# Patient Record
Sex: Female | Born: 1945 | Race: White | Hispanic: No | Marital: Married | Smoking: Never smoker
Health system: Southern US, Community
[De-identification: ages and names within clinical notes are randomized; demographics above are authoritative.]

## PROBLEM LIST (undated history)

## (undated) DIAGNOSIS — L309 Dermatitis, unspecified: Secondary | ICD-10-CM

## (undated) DIAGNOSIS — E039 Hypothyroidism, unspecified: Secondary | ICD-10-CM

## (undated) DIAGNOSIS — K5792 Diverticulitis of intestine, part unspecified, without perforation or abscess without bleeding: Secondary | ICD-10-CM

## (undated) DIAGNOSIS — D699 Hemorrhagic condition, unspecified: Secondary | ICD-10-CM

## (undated) DIAGNOSIS — M5126 Other intervertebral disc displacement, lumbar region: Secondary | ICD-10-CM

## (undated) DIAGNOSIS — M5030 Other cervical disc degeneration, unspecified cervical region: Secondary | ICD-10-CM

## (undated) DIAGNOSIS — G709 Myoneural disorder, unspecified: Secondary | ICD-10-CM

## (undated) HISTORY — PX: CHOLECYSTECTOMY: SHX55

## (undated) HISTORY — PX: COLONOSCOPY: SHX174

---

## 1999-07-22 ENCOUNTER — Other Ambulatory Visit: Admission: RE | Admit: 1999-07-22 | Discharge: 1999-07-22 | Payer: Self-pay | Admitting: Gynecology

## 1999-12-15 ENCOUNTER — Encounter: Payer: Self-pay | Admitting: Gynecology

## 1999-12-15 ENCOUNTER — Encounter: Admission: RE | Admit: 1999-12-15 | Discharge: 1999-12-15 | Payer: Self-pay | Admitting: Gynecology

## 2000-12-17 ENCOUNTER — Encounter: Payer: Self-pay | Admitting: Gynecology

## 2000-12-17 ENCOUNTER — Encounter: Admission: RE | Admit: 2000-12-17 | Discharge: 2000-12-17 | Payer: Self-pay | Admitting: Gynecology

## 2000-12-31 ENCOUNTER — Encounter: Admission: RE | Admit: 2000-12-31 | Discharge: 2000-12-31 | Payer: Self-pay | Admitting: Internal Medicine

## 2000-12-31 ENCOUNTER — Encounter (HOSPITAL_BASED_OUTPATIENT_CLINIC_OR_DEPARTMENT_OTHER): Payer: Self-pay | Admitting: Internal Medicine

## 2001-04-29 ENCOUNTER — Encounter: Admission: RE | Admit: 2001-04-29 | Discharge: 2001-04-29 | Payer: Self-pay | Admitting: Internal Medicine

## 2001-04-29 ENCOUNTER — Encounter: Payer: Self-pay | Admitting: Internal Medicine

## 2001-12-20 ENCOUNTER — Encounter: Admission: RE | Admit: 2001-12-20 | Discharge: 2001-12-20 | Payer: Self-pay | Admitting: Internal Medicine

## 2001-12-20 ENCOUNTER — Encounter: Payer: Self-pay | Admitting: Internal Medicine

## 2002-05-23 ENCOUNTER — Other Ambulatory Visit: Admission: RE | Admit: 2002-05-23 | Discharge: 2002-05-23 | Payer: Self-pay | Admitting: Gynecology

## 2003-05-24 ENCOUNTER — Encounter: Admission: RE | Admit: 2003-05-24 | Discharge: 2003-05-24 | Payer: Self-pay | Admitting: Internal Medicine

## 2003-06-25 ENCOUNTER — Encounter: Admission: RE | Admit: 2003-06-25 | Discharge: 2003-06-25 | Payer: Self-pay | Admitting: Diagnostic Radiology

## 2004-02-14 ENCOUNTER — Other Ambulatory Visit: Admission: RE | Admit: 2004-02-14 | Discharge: 2004-02-14 | Payer: Self-pay | Admitting: Gynecology

## 2004-06-18 ENCOUNTER — Encounter: Admission: RE | Admit: 2004-06-18 | Discharge: 2004-06-18 | Payer: Self-pay | Admitting: Internal Medicine

## 2005-07-03 ENCOUNTER — Encounter: Admission: RE | Admit: 2005-07-03 | Discharge: 2005-07-03 | Payer: Self-pay | Admitting: Internal Medicine

## 2006-07-09 ENCOUNTER — Encounter: Admission: RE | Admit: 2006-07-09 | Discharge: 2006-07-09 | Payer: Self-pay | Admitting: Internal Medicine

## 2007-07-13 ENCOUNTER — Encounter: Admission: RE | Admit: 2007-07-13 | Discharge: 2007-07-13 | Payer: Self-pay | Admitting: Internal Medicine

## 2007-07-19 ENCOUNTER — Encounter: Admission: RE | Admit: 2007-07-19 | Discharge: 2007-07-19 | Payer: Self-pay | Admitting: Internal Medicine

## 2008-07-23 ENCOUNTER — Encounter: Admission: RE | Admit: 2008-07-23 | Discharge: 2008-07-23 | Payer: Self-pay | Admitting: Gynecology

## 2008-09-15 ENCOUNTER — Encounter: Admission: RE | Admit: 2008-09-15 | Discharge: 2008-09-15 | Payer: Self-pay | Admitting: Orthopedic Surgery

## 2009-07-26 ENCOUNTER — Encounter: Admission: RE | Admit: 2009-07-26 | Discharge: 2009-07-26 | Payer: Self-pay | Admitting: Internal Medicine

## 2010-08-21 ENCOUNTER — Encounter
Admission: RE | Admit: 2010-08-21 | Discharge: 2010-08-21 | Payer: Self-pay | Source: Home / Self Care | Attending: Internal Medicine | Admitting: Internal Medicine

## 2011-09-02 ENCOUNTER — Other Ambulatory Visit: Payer: Self-pay | Admitting: Internal Medicine

## 2011-09-02 DIAGNOSIS — Z1231 Encounter for screening mammogram for malignant neoplasm of breast: Secondary | ICD-10-CM

## 2011-09-02 DIAGNOSIS — Z780 Asymptomatic menopausal state: Secondary | ICD-10-CM

## 2011-10-01 ENCOUNTER — Ambulatory Visit: Payer: Self-pay

## 2011-10-01 ENCOUNTER — Other Ambulatory Visit: Payer: Self-pay

## 2011-10-14 ENCOUNTER — Other Ambulatory Visit: Payer: Self-pay

## 2011-10-14 ENCOUNTER — Ambulatory Visit: Payer: Self-pay

## 2012-01-06 ENCOUNTER — Ambulatory Visit
Admission: RE | Admit: 2012-01-06 | Discharge: 2012-01-06 | Disposition: A | Discharge status: Home / Self Care | Payer: Medicare Other | Source: Ambulatory Visit | Attending: Internal Medicine | Admitting: Internal Medicine

## 2012-01-06 DIAGNOSIS — Z780 Asymptomatic menopausal state: Secondary | ICD-10-CM

## 2012-01-06 DIAGNOSIS — Z1231 Encounter for screening mammogram for malignant neoplasm of breast: Secondary | ICD-10-CM

## 2013-01-30 ENCOUNTER — Other Ambulatory Visit: Payer: Self-pay

## 2013-01-30 DIAGNOSIS — Z1231 Encounter for screening mammogram for malignant neoplasm of breast: Secondary | ICD-10-CM

## 2013-03-01 ENCOUNTER — Ambulatory Visit: Payer: Medicare Other

## 2013-03-23 ENCOUNTER — Ambulatory Visit
Admission: RE | Admit: 2013-03-23 | Discharge: 2013-03-23 | Disposition: A | Discharge status: Home / Self Care | Payer: Medicare Other | Source: Ambulatory Visit

## 2013-03-23 DIAGNOSIS — Z1231 Encounter for screening mammogram for malignant neoplasm of breast: Secondary | ICD-10-CM

## 2014-03-12 ENCOUNTER — Other Ambulatory Visit: Payer: Self-pay | Admitting: Internal Medicine

## 2014-03-12 ENCOUNTER — Other Ambulatory Visit: Payer: Self-pay

## 2014-03-12 DIAGNOSIS — M8580 Other specified disorders of bone density and structure, unspecified site: Secondary | ICD-10-CM

## 2014-03-12 DIAGNOSIS — Z1231 Encounter for screening mammogram for malignant neoplasm of breast: Secondary | ICD-10-CM

## 2014-04-12 ENCOUNTER — Ambulatory Visit
Admission: RE | Admit: 2014-04-12 | Discharge: 2014-04-12 | Disposition: A | Discharge status: Home / Self Care | Payer: Medicare Other | Source: Ambulatory Visit | Attending: Internal Medicine | Admitting: Internal Medicine

## 2014-04-12 ENCOUNTER — Ambulatory Visit
Admission: RE | Admit: 2014-04-12 | Discharge: 2014-04-12 | Disposition: A | Discharge status: Home / Self Care | Payer: Medicare Other | Source: Ambulatory Visit

## 2014-04-12 DIAGNOSIS — Z1231 Encounter for screening mammogram for malignant neoplasm of breast: Secondary | ICD-10-CM

## 2014-04-12 DIAGNOSIS — M8580 Other specified disorders of bone density and structure, unspecified site: Secondary | ICD-10-CM

## 2015-03-20 ENCOUNTER — Other Ambulatory Visit: Payer: Self-pay

## 2015-03-20 DIAGNOSIS — Z1231 Encounter for screening mammogram for malignant neoplasm of breast: Secondary | ICD-10-CM

## 2015-04-25 ENCOUNTER — Ambulatory Visit: Payer: Self-pay

## 2015-06-20 ENCOUNTER — Ambulatory Visit
Admission: RE | Admit: 2015-06-20 | Discharge: 2015-06-20 | Disposition: A | Discharge status: Home / Self Care | Payer: Medicare Other | Source: Ambulatory Visit

## 2015-06-20 DIAGNOSIS — Z1231 Encounter for screening mammogram for malignant neoplasm of breast: Secondary | ICD-10-CM

## 2015-12-05 ENCOUNTER — Other Ambulatory Visit: Payer: Self-pay | Admitting: Internal Medicine

## 2015-12-05 ENCOUNTER — Ambulatory Visit
Admission: RE | Admit: 2015-12-05 | Discharge: 2015-12-05 | Disposition: A | Discharge status: Home / Self Care | Payer: Medicare Other | Source: Ambulatory Visit | Attending: Internal Medicine | Admitting: Internal Medicine

## 2015-12-05 DIAGNOSIS — R1030 Lower abdominal pain, unspecified: Secondary | ICD-10-CM

## 2015-12-20 ENCOUNTER — Encounter (HOSPITAL_COMMUNITY): Payer: Self-pay | Admitting: *Deleted

## 2015-12-21 ENCOUNTER — Encounter (HOSPITAL_COMMUNITY): Payer: Self-pay | Admitting: Anesthesiology

## 2015-12-21 ENCOUNTER — Ambulatory Visit (HOSPITAL_COMMUNITY): Payer: Medicare Other | Admitting: Anesthesiology

## 2015-12-21 ENCOUNTER — Ambulatory Visit (HOSPITAL_COMMUNITY)
Admission: RE | Admit: 2015-12-21 | Discharge: 2015-12-21 | Disposition: A | Discharge status: Home / Self Care | Payer: Medicare Other | Source: Ambulatory Visit | Attending: Orthopedic Surgery | Admitting: Orthopedic Surgery

## 2015-12-21 ENCOUNTER — Encounter (HOSPITAL_COMMUNITY)
Admission: RE | Disposition: A | Discharge status: Home / Self Care | Payer: Self-pay | Source: Ambulatory Visit | Attending: Orthopedic Surgery

## 2015-12-21 DIAGNOSIS — E039 Hypothyroidism, unspecified: Secondary | ICD-10-CM | POA: Diagnosis not present

## 2015-12-21 DIAGNOSIS — W19XXXA Unspecified fall, initial encounter: Secondary | ICD-10-CM | POA: Insufficient documentation

## 2015-12-21 DIAGNOSIS — S52571A Other intraarticular fracture of lower end of right radius, initial encounter for closed fracture: Secondary | ICD-10-CM | POA: Insufficient documentation

## 2015-12-21 DIAGNOSIS — Z79899 Other long term (current) drug therapy: Secondary | ICD-10-CM | POA: Insufficient documentation

## 2015-12-21 HISTORY — DX: Other intervertebral disc displacement, lumbar region: M51.26

## 2015-12-21 HISTORY — DX: Myoneural disorder, unspecified: G70.9

## 2015-12-21 HISTORY — DX: Diverticulitis of intestine, part unspecified, without perforation or abscess without bleeding: K57.92

## 2015-12-21 HISTORY — DX: Hemorrhagic condition, unspecified: D69.9

## 2015-12-21 HISTORY — PX: OPEN REDUCTION INTERNAL FIXATION (ORIF) DISTAL RADIAL FRACTURE: SHX5989

## 2015-12-21 HISTORY — DX: Hypothyroidism, unspecified: E03.9

## 2015-12-21 HISTORY — DX: Other cervical disc degeneration, unspecified cervical region: M50.30

## 2015-12-21 HISTORY — DX: Dermatitis, unspecified: L30.9

## 2015-12-21 LAB — POCT I-STAT, CHEM 8
BUN: 10 mg/dL (ref 6–20)
CHLORIDE: 99 mmol/L — AB (ref 101–111)
CREATININE: 0.5 mg/dL (ref 0.44–1.00)
Calcium, Ion: 1.13 mmol/L (ref 1.13–1.30)
GLUCOSE: 125 mg/dL — AB (ref 65–99)
HEMATOCRIT: 47 % — AB (ref 36.0–46.0)
HEMOGLOBIN: 16 g/dL — AB (ref 12.0–15.0)
POTASSIUM: 3.7 mmol/L (ref 3.5–5.1)
Sodium: 141 mmol/L (ref 135–145)
TCO2: 31 mmol/L (ref 0–100)

## 2015-12-21 SURGERY — OPEN REDUCTION INTERNAL FIXATION (ORIF) DISTAL RADIUS FRACTURE
Anesthesia: Monitor Anesthesia Care | Site: Wrist | Laterality: Right

## 2015-12-21 MED ORDER — PROPOFOL 500 MG/50ML IV EMUL: INTRAVENOUS | Status: DC | PRN

## 2015-12-21 MED ORDER — PROPOFOL 10 MG/ML IV BOLUS
INTRAVENOUS | Status: AC
  Filled 2015-12-21: qty 20

## 2015-12-21 MED ORDER — 0.9 % SODIUM CHLORIDE (POUR BTL) OPTIME
TOPICAL | Status: DC | PRN
  Administered 2015-12-21: 1000 mL

## 2015-12-21 MED ORDER — MIDAZOLAM HCL 5 MG/5ML IJ SOLN
INTRAMUSCULAR | Status: DC | PRN
  Administered 2015-12-21: 2 mg via INTRAVENOUS

## 2015-12-21 MED ORDER — ONDANSETRON HCL 4 MG/2ML IJ SOLN
INTRAMUSCULAR | Status: DC | PRN
  Administered 2015-12-21: 4 mg via INTRAVENOUS

## 2015-12-21 MED ORDER — HYDROMORPHONE HCL 1 MG/ML IJ SOLN: 0.2500 mg | INTRAMUSCULAR | Status: DC | PRN

## 2015-12-21 MED ORDER — MIDAZOLAM HCL 2 MG/2ML IJ SOLN
INTRAMUSCULAR | Status: AC
  Filled 2015-12-21: qty 2

## 2015-12-21 MED ORDER — FENTANYL CITRATE (PF) 250 MCG/5ML IJ SOLN
INTRAMUSCULAR | Status: AC
  Filled 2015-12-21: qty 5

## 2015-12-21 MED ORDER — OXYCODONE-ACETAMINOPHEN 5-325 MG PO TABS: 1.0000 | ORAL_TABLET | ORAL | Status: DC | PRN

## 2015-12-21 MED ORDER — ONDANSETRON HCL 4 MG/2ML IJ SOLN: 4.0000 mg | Freq: Once | INTRAMUSCULAR | Status: DC | PRN

## 2015-12-21 MED ORDER — MEPERIDINE HCL 25 MG/ML IJ SOLN: 6.2500 mg | INTRAMUSCULAR | Status: DC | PRN

## 2015-12-21 MED ORDER — DOCUSATE SODIUM 100 MG PO CAPS: 100.0000 mg | ORAL_CAPSULE | Freq: Two times a day (BID) | ORAL | Status: DC

## 2015-12-21 MED ORDER — LACTATED RINGERS IV SOLN
INTRAVENOUS | Status: DC
Start: 2015-12-21 — End: 2015-12-21
  Administered 2015-12-21: 08:00:00 via INTRAVENOUS

## 2015-12-21 MED ORDER — FENTANYL CITRATE (PF) 100 MCG/2ML IJ SOLN
INTRAMUSCULAR | Status: DC | PRN
  Administered 2015-12-21 (×4): 25 ug via INTRAVENOUS

## 2015-12-21 MED ORDER — CHLORHEXIDINE GLUCONATE 4 % EX LIQD: 60.0000 mL | Freq: Once | CUTANEOUS | Status: DC

## 2015-12-21 MED ORDER — METHOCARBAMOL 500 MG PO TABS: 500.0000 mg | ORAL_TABLET | Freq: Four times a day (QID) | ORAL | Status: DC

## 2015-12-21 MED ORDER — CEFAZOLIN SODIUM-DEXTROSE 2-4 GM/100ML-% IV SOLN
2.0000 g | INTRAVENOUS | Status: AC
  Administered 2015-12-21: 2 g via INTRAVENOUS
  Filled 2015-12-21: qty 100

## 2015-12-21 MED ORDER — VITAMIN C 500 MG PO TABS: 500.0000 mg | ORAL_TABLET | Freq: Every day | ORAL | Status: DC

## 2015-12-21 MED ORDER — PROPOFOL 500 MG/50ML IV EMUL
INTRAVENOUS | Status: DC | PRN
  Administered 2015-12-21: 50 ug/kg/min via INTRAVENOUS

## 2015-12-21 SURGICAL SUPPLY — 60 items
BANDAGE ACE 3X5.8 VEL STRL LF (GAUZE/BANDAGES/DRESSINGS) ×2 IMPLANT
BANDAGE ELASTIC 3 VELCRO ST LF (GAUZE/BANDAGES/DRESSINGS) ×1 IMPLANT
BANDAGE ELASTIC 4 VELCRO ST LF (GAUZE/BANDAGES/DRESSINGS) ×3 IMPLANT
BIT DRILL 2.2 SS TIBIAL (BIT) ×2 IMPLANT
BLADE SURG ROTATE 9660 (MISCELLANEOUS) IMPLANT
BNDG CMPR 9X4 STRL LF SNTH (GAUZE/BANDAGES/DRESSINGS) ×1
BNDG ESMARK 4X9 LF (GAUZE/BANDAGES/DRESSINGS) ×3 IMPLANT
BNDG GAUZE ELAST 4 BULKY (GAUZE/BANDAGES/DRESSINGS) ×3 IMPLANT
CANISTER SUCTION 2500CC (MISCELLANEOUS) ×3 IMPLANT
CORDS BIPOLAR (ELECTRODE) ×3 IMPLANT
COVER SURGICAL LIGHT HANDLE (MISCELLANEOUS) ×3 IMPLANT
CUFF TOURNIQUET SINGLE 18IN (TOURNIQUET CUFF) ×3 IMPLANT
CUFF TOURNIQUET SINGLE 24IN (TOURNIQUET CUFF) IMPLANT
DECANTER SPIKE VIAL GLASS SM (MISCELLANEOUS) ×1 IMPLANT
DRAPE OEC MINIVIEW 54X84 (DRAPES) ×3 IMPLANT
DRAPE SURG 17X11 SM STRL (DRAPES) ×3 IMPLANT
DRSG ADAPTIC 3X8 NADH LF (GAUZE/BANDAGES/DRESSINGS) ×3 IMPLANT
GAUZE SPONGE 4X4 12PLY STRL (GAUZE/BANDAGES/DRESSINGS) ×3 IMPLANT
GAUZE SPONGE 4X4 16PLY XRAY LF (GAUZE/BANDAGES/DRESSINGS) ×1 IMPLANT
GLOVE BIOGEL PI IND STRL 8.5 (GLOVE) ×1 IMPLANT
GLOVE BIOGEL PI INDICATOR 8.5 (GLOVE) ×2
GLOVE SURG ORTHO 8.0 STRL STRW (GLOVE) ×3 IMPLANT
GOWN STRL REUS W/ TWL LRG LVL3 (GOWN DISPOSABLE) ×1 IMPLANT
GOWN STRL REUS W/ TWL XL LVL3 (GOWN DISPOSABLE) ×1 IMPLANT
GOWN STRL REUS W/TWL LRG LVL3 (GOWN DISPOSABLE) ×3
GOWN STRL REUS W/TWL XL LVL3 (GOWN DISPOSABLE) ×3
K-WIRE 1.6 (WIRE) ×3
K-WIRE FX5X1.6XNS BN SS (WIRE) ×1
KIT BASIN OR (CUSTOM PROCEDURE TRAY) ×3 IMPLANT
KIT ROOM TURNOVER OR (KITS) ×3 IMPLANT
KWIRE FX5X1.6XNS BN SS (WIRE) IMPLANT
NDL HYPO 25X1 1.5 SAFETY (NEEDLE) ×1 IMPLANT
NEEDLE HYPO 25X1 1.5 SAFETY (NEEDLE) IMPLANT
NS IRRIG 1000ML POUR BTL (IV SOLUTION) ×3 IMPLANT
PACK ORTHO EXTREMITY (CUSTOM PROCEDURE TRAY) ×3 IMPLANT
PAD ARMBOARD 7.5X6 YLW CONV (MISCELLANEOUS) ×6 IMPLANT
PAD CAST 4YDX4 CTTN HI CHSV (CAST SUPPLIES) ×1 IMPLANT
PADDING CAST COTTON 4X4 STRL (CAST SUPPLIES) ×3
PEG LOCKING SMOOTH 2.2X18 (Peg) ×2 IMPLANT
PEG LOCKING SMOOTH 2.2X20 (Screw) ×6 IMPLANT
PEG LOCKING SMOOTH 2.2X22 (Screw) ×4 IMPLANT
PLATE DVR CROSSLOCK STD RT (Plate) ×2 IMPLANT
PUTTY DBM STAGRAFT PLUS 2CC (Putty) ×2 IMPLANT
SCREW LOCK 14X2.7X 3 LD TPR (Screw) IMPLANT
SCREW LOCK 16X2.7X 3 LD TPR (Screw) IMPLANT
SCREW LOCKING 2.7X14 (Screw) ×6 IMPLANT
SCREW LOCKING 2.7X16 (Screw) ×9 IMPLANT
SOAP 2 % CHG 4 OZ (WOUND CARE) ×3 IMPLANT
SPLINT FIBERGLASS 3X35 (CAST SUPPLIES) ×2 IMPLANT
SPONGE LAP 4X18 X RAY DECT (DISPOSABLE) ×1 IMPLANT
SUT PROLENE 4 0 PS 2 18 (SUTURE) ×4 IMPLANT
SUT VIC AB 2-0 FS1 27 (SUTURE) ×2 IMPLANT
SUT VICRYL 4-0 PS2 18IN ABS (SUTURE) ×2 IMPLANT
SYR CONTROL 10ML LL (SYRINGE) IMPLANT
TOWEL OR 17X24 6PK STRL BLUE (TOWEL DISPOSABLE) ×3 IMPLANT
TOWEL OR 17X26 10 PK STRL BLUE (TOWEL DISPOSABLE) ×3 IMPLANT
TUBE CONNECTING 12'X1/4 (SUCTIONS) ×1
TUBE CONNECTING 12X1/4 (SUCTIONS) ×2 IMPLANT
WATER STERILE IRR 1000ML POUR (IV SOLUTION) ×3 IMPLANT
YANKAUER SUCT BULB TIP NO VENT (SUCTIONS) IMPLANT

## 2015-12-21 NOTE — Progress Notes (Signed)
Orthopedic Tech Progress Note Patient Details:  Janet HumanRuth Ann Janet 1945-11-23 [id number]  Ortho Devices Type of Ortho Device: Arm sling Ortho Device/Splint Location: rue Ortho Device/Splint Interventions: Application   Alison Kubicki 12/21/2015, 11:06 AM

## 2015-12-21 NOTE — Brief Op Note (Signed)
12/21/2015  7:44 AM  PATIENT:  Janet Janet  70 y.o. female  PRE-OPERATIVE DIAGNOSIS:  RIGHT DISTAL RADIUS FRACTURE DISPLACED  POST-OPERATIVE DIAGNOSIS:  * No post-op diagnosis entered *  PROCEDURE:  Procedure(s): RIGHT OPEN REDUCTION INTERNAL FIXATION (ORIF) DISTAL RADIAL FRACTURE AND REPAIR AS NECESSARY (Right)  SURGEON:  Surgeon(s) and Role:    * Bradly BienenstockFred Gayna Braddy, MD - Primary  PHYSICIAN ASSISTANT:   ASSISTANTS: none   ANESTHESIA:   regional  EBL:     BLOOD ADMINISTERED:none  DRAINS: none   LOCAL MEDICATIONS USED:  MARCAINE     SPECIMEN:  No Specimen  DISPOSITION OF SPECIMEN:  N/A  COUNTS:  YES  TOURNIQUET:    DICTATION: .Other Dictation: Dictation Number [id number]  PLAN OF CARE: Discharge to home after PACU  PATIENT DISPOSITION:  PACU - hemodynamically stable.   Delay start of Pharmacological VTE agent (>24hrs) due to surgical blood loss or risk of bleeding: not applicable

## 2015-12-21 NOTE — Anesthesia Postprocedure Evaluation (Signed)
Anesthesia Post Note  Patient: Brown HumanRuth Ann Buescher  Procedure(s) Performed: Procedure(s) (LRB): RIGHT OPEN REDUCTION INTERNAL FIXATION (ORIF) DISTAL RADIAL FRACTURE AND REPAIR AS NECESSARY (Right)  Patient location during evaluation: PACU Anesthesia Type: Regional Level of consciousness: awake and alert and patient cooperative Pain management: pain level controlled Vital Signs Assessment: post-procedure vital signs reviewed and stable Respiratory status: spontaneous breathing and respiratory function stable Cardiovascular status: stable Anesthetic complications: no    Last Vitals:  Filed Vitals:   12/21/15 1109 12/21/15 1124  BP: 136/58 135/82  Pulse: 69 72  Temp:    Resp: 15 15    Last Pain:  Filed Vitals:   12/21/15 1132  PainSc: 3                  Fatou Dunnigan DAVID

## 2015-12-21 NOTE — Progress Notes (Signed)
Orthopedic Tech Progress Note Patient Details:  Janet Brown June 17, 1946 [id number]  Patient ID: Brown Humanuth Ann Brosious, female   DOB: June 17, 1946, 70 y.o.   MRN: [id number] Viewed order from doctor's order list  [name], [name] 12/21/2015, 11:07 AM

## 2015-12-21 NOTE — Anesthesia Preprocedure Evaluation (Signed)
Anesthesia Evaluation  Patient identified by MRN, date of birth, ID band Patient awake    Reviewed: Allergy & Precautions, NPO status , Patient's Chart, lab work & pertinent test results  Airway Mallampati: I  TM Distance: >3 FB Neck ROM: Full    Dental   Pulmonary    Pulmonary exam normal       Cardiovascular Normal cardiovascular exam    Neuro/Psych    GI/Hepatic   Endo/Other    Renal/GU      Musculoskeletal   Abdominal   Peds  Hematology   Anesthesia Other Findings   Reproductive/Obstetrics                             Anesthesia Physical Anesthesia Plan  ASA: II  Anesthesia Plan: Regional   Post-op Pain Management:    Induction: Intravenous  Airway Management Planned: Natural Airway  Additional Equipment:   Intra-op Plan:   Post-operative Plan:   Informed Consent: I have reviewed the patients History and Physical, chart, labs and discussed the procedure including the risks, benefits and alternatives for the proposed anesthesia with the patient or authorized representative who has indicated his/her understanding and acceptance.     Plan Discussed with: CRNA and Surgeon  Anesthesia Plan Comments:         Anesthesia Quick Evaluation  

## 2015-12-21 NOTE — Transfer of Care (Signed)
Immediate Anesthesia Transfer of Care Note  Patient: Janet Janet  Procedure(s) Performed: Procedure(s): RIGHT OPEN REDUCTION INTERNAL FIXATION (ORIF) DISTAL RADIAL FRACTURE AND REPAIR AS NECESSARY (Right)  Patient Location: PACU  Anesthesia Type:MAC and Regional  Level of Consciousness: awake, alert , oriented and sedated  Airway & Oxygen Therapy: Patient Spontanous Breathing and Patient connected to nasal cannula oxygen  Post-op Assessment: Report given to RN, Post -op Vital signs reviewed and stable and Patient moving all extremities  Post vital signs: Reviewed and stable  Last Vitals:  Filed Vitals:   12/21/15 0757  BP: 152/83  Pulse: 76  Temp: 37.2 C  Resp: 16    Last Pain:  Filed Vitals:   12/21/15 0801  PainSc: 3       Patients Stated Pain Goal: 4 (12/21/15 0757)  Complications: No apparent anesthesia complications

## 2015-12-21 NOTE — Discharge Instructions (Signed)
KEEP BANDAGE CLEAN AND DRY CALL OFFICE FOR F/U APPT [phone] in 13 days DR [facility] [phone] KEEP HAND ELEVATED ABOVE HEART OK TO APPLY ICE TO OPERATIVE AREA CONTACT OFFICE IF ANY WORSENING PAIN OR CONCERNS.

## 2015-12-21 NOTE — H&P (Signed)
Janet Janet is an 70 y.o. female.   Chief Complaint: Right wrist injury after a fall HPI: Pt sustained closed right distal radius fracture Pt here for surgery No prior surgery to right wrist Pt seen/evaluated in office yesterday  Past Medical History  Diagnosis Date  . Bleeds easily (HCC)   . Hypothyroidism   . HNP (herniated nucleus pulposus), lumbar     per patient- doesnt have much pain  . DDD (degenerative disc disease), cervical     arthritis - Shoulder  . Diverticulitis   . Neuromuscular disorder (HCC)     "bad nerve right arm"  . Eczema     Past Surgical History  Procedure Laterality Date  . Cholecystectomy    . Colonoscopy      History reviewed. No pertinent family history. Social History:  reports that she has never smoked. She does not have any smokeless tobacco history on file. She reports that she does not drink alcohol or use illicit drugs.  Allergies:  Allergies  Allergen Reactions  . Other     DARVOCET    Medications Prior to Admission  Medication Sig Dispense Refill  . acetaminophen (TYLENOL) 325 MG tablet Take 650 mg by mouth every 6 (six) hours as needed.    Marland Kitchen. ibuprofen (ADVIL,MOTRIN) 200 MG tablet Take 200 mg by mouth every 6 (six) hours as needed.    Marland Kitchen. levothyroxine (SYNTHROID, LEVOTHROID) 100 MCG tablet Take 100 mcg by mouth daily before breakfast.      No results found for this or any previous visit (from the past 48 hour(s)). No results found.  ROSNO RECENT ILLNESSES OR HOSPITALIZATIONS  Height 5' 7.5" (1.715 m), weight 65.318 kg (144 lb). Physical Exam  General Appearance:  Alert, cooperative, no distress, appears stated age  Head:  Normocephalic, without obvious abnormality, atraumatic  Eyes:  Pupils equal, conjunctiva/corneas clear,         Throat: Lips, mucosa, and tongue normal; teeth and gums normal  Neck: No visible masses     Lungs:   respirations unlabored  Chest Wall:  No tenderness or deformity  Heart:  Regular rate and  rhythm,  Abdomen:   Soft, non-tender,         Extremities: RUE: SKIN INTACT FINGERS WARM WELL PERFUSED ABLE TO FLEX AND EXTEND THUMB GOOD DIGITAL MOTION  Pulses: 2+ and symmetric  Skin: Skin color, texture, turgor normal, no rashes or lesions     Neurologic: Normal    Assessment/Plan RIGHT COMMIINUTED DISTAL RADIUS FRACTURE, DISPLACED VOLARLY  RIGHT WRIST OPEN REDUCTION AND INTERNAL FIXATION AND REPAIR AS INDICATED  R/B/A DISCUSSED WITH PT IN OFFICE.  PT VOICED UNDERSTANDING OF PLAN CONSENT SIGNED DAY OF SURGERY PT SEEN AND EXAMINED PRIOR TO OPERATIVE PROCEDURE/DAY OF SURGERY SITE MARKED. QUESTIONS ANSWERED WILL GO HOME FOLLOWING SURGERY  WE ARE PLANNING SURGERY FOR YOUR UPPER EXTREMITY. THE RISKS AND BENEFITS OF SURGERY INCLUDE BUT NOT LIMITED TO BLEEDING INFECTION, DAMAGE TO NEARBY NERVES ARTERIES TENDONS, FAILURE OF SURGERY TO ACCOMPLISH ITS INTENDED GOALS, PERSISTENT SYMPTOMS AND NEED FOR FURTHER SURGICAL INTERVENTION. WITH THIS IN MIND WE WILL PROCEED. I HAVE DISCUSSED WITH THE PATIENT THE PRE AND POSTOPERATIVE REGIMEN AND THE DOS AND DON'TS. PT VOICED UNDERSTANDING AND INFORMED CONSENT SIGNED.  Sharma CovertORTMANN,Janet Janet 12/21/2015, 7:39 AM

## 2015-12-23 ENCOUNTER — Encounter (HOSPITAL_COMMUNITY): Payer: Self-pay | Admitting: Orthopedic Surgery

## 2015-12-23 NOTE — Op Note (Signed)
NAME:  Janet Brown, Janet Brown                  ACCOUNT NO.:  [id number]  MEDICAL RECORD NO.:  [id number]  LOCATION:                                FACILITY:  [facility]  PHYSICIAN:  Sharma CovertFred W. Hulen Mandler IV, M.D.DATE OF BIRTH:  [birth year]  DATE OF PROCEDURE:  12/21/2015 DATE OF DISCHARGE:  12/21/2015                              OPERATIVE REPORT   PREOPERATIVE DIAGNOSIS:  Right wrist intra-articular distal radius fracture, 3 or more fragments.  POSTOPERATIVE DIAGNOSIS:  Right wrist intra-articular distal radius fracture, 3 or more fragments.  ATTENDING SURGEON:  Sharma CovertFred W. Gaberiel Youngblood IV, MD, who was scrubbed and present for the entire procedure.  ASSISTANT SURGEON:  None.  ANESTHESIA:  Supraclavicular block with IV sedation.  PROCEDURES: 1. Open reduction and internal fixation of displaced intra-articular     distal radius fracture, 3 or more fragments. 2. Right wrist brachioradialis tendon release. 3. Radiographs 3 views, right wrist.  RADIOGRAPHIC INTERPRETATION:  AP, lateral, and oblique views of the wrist did show the volar plate fixation placed in good position with good restoration of the radial height, inclination, and tilt.  SURGICAL IMPLANTS:  DVR Crosslock standard plate.  SURGICAL INDICATIONS:  Janet Brown is a right-hand-dominant female, who sustained a comminuted intra-articular distal radius fracture.  The patient was seen and evaluated in the office and recommended to undergo the above procedure.  Risks, benefits, and alternatives were discussed in detail with the patient.  Signed informed consent was obtained.  Risks include, but not limited to bleeding; infection; damage to nearby nerves, arteries, or tendons; loss of motion of wrist and digits; incomplete relief of symptoms; and need for further surgical intervention.  DESCRIPTION OF PROCEDURE:  The patient was properly identified in the preoperative holding area and marked with a permanent marker on the right wrist to indicate the  correct operative site.  The patient was brought back to the operating room, placed supine on the anesthesia room table.  General anesthesia was administered.  The patient tolerated this well.  A well-padded tourniquet was placed on right brachium and sealed with a 1000 drape.  The right upper extremity was then prepped and draped in normal sterile fashion.  Time-out was called, correct site was identified, and procedure then begun.  Attention was then turned to the right wrist.  A longitudinal incision made directly over the FCR sheath, dissection was then carried down through the skin and subcutaneous tissue.  The FCR sheath was then opened proximally and distally. Going through the floor of the FCR sheath, the FPL was then carefully swept out of the way.  The pronator quadratus was then elevated.  The fracture site was exposed.  Careful release of the brachioradialis was then carried out along the radial styloid release and the radial column. The first dorsal compartment tendons were then carefully protected.  The wound was then thoroughly irrigated.  2 mL of Biomet StaGraft was then placed into the volar defect and an open reduction was then performed. This was an intra-articular fracture; 3 or more fragments.  An open reduction was then performed with the comminuted volar portion extending into the joint.  A volar plate was then applied  and held distally with a K-wire.  The plate height was then adjusted using the oblong screw hole proximally. After fixation was then achieved, confirmed using mini C-arm, distal peg fixation was then carried out from an ulnar to radial direction with the distal locking pegs.  Screw fixation was then carried out on the shaft, completing the construct.  The wound was then thoroughly irrigated. Copious wound irrigation done.  Final radiographs were then obtained.  2- 0 Vicryl was used to close the pronator quadratus.  Subcutaneous tissues were closed  with 4-0 Vicryl, and skin was closed with 4-0 Prolene. Adaptic dressing and sterile compressive bandage were then applied.  The patient then placed in a well-padded sugar-tong splint.  Taken to recovery room in good condition.  Post stress examination of the wrist did not reveal any instability of the distal radioulnar joint.  There was good alignment.  The patient did have a small ulnar styloid fracture, but without any instability, no widening of the SL interval under stress radiography intraoperatively.  POSTPROCEDURAL PLAN:  The patient will be discharged home, seen back in the office in approximately 2 weeks for wound check, suture removal, application of a short-arm cast, put in the order for therapy to begin at 4-week mark, therapy at 4-week mark.  Radiographs at each visit.     Madelynn Done, M.D.     FWO/MEDQ  D:  12/21/2015  T:  12/21/2015  Job:  [id number]

## 2016-01-02 NOTE — Addendum Note (Signed)
Addendum  created 01/02/16 2231 by Arta BruceKevin Leily Capek, MD   Modules edited: Anesthesia Blocks and Procedures, Clinical Notes   Clinical Notes:  File: [id number]

## 2016-01-02 NOTE — Anesthesia Procedure Notes (Signed)
Anesthesia Regional Block:  Supraclavicular block  Pre-Anesthetic Checklist: ,, timeout performed, Correct Patient, Correct Site, Correct Laterality, Correct Procedure, Correct Position, site marked, Risks and benefits discussed,  Surgical consent,  Pre-op evaluation,  At surgeon's request and post-op pain management  Laterality: Right  Prep: chloraprep       Needles:  Injection technique: Single-shot  Needle Type: Echogenic Stimulator Needle     Needle Length: 9cm 9 cm Needle Gauge: 21 and 21 G    Additional Needles:  Procedures: ultrasound guided (picture in chart) and nerve stimulator Supraclavicular block  Nerve Stimulator or Paresthesia:  Response: 0.4 mA,   Additional Responses:   Narrative:  Start time: 12/21/2015 9:05 AM End time: 12/21/2015 9:15 AM Injection made incrementally with aspirations every 5 mL.  Performed by: Personally  Anesthesiologist: Arta BruceSSEY, Ifeoma Vallin  Additional Notes: Monitors applied. Patient sedated. Sterile prep and drape,hand hygiene and sterile gloves were used. Relevant anatomy identified.Needle position confirmed.Local anesthetic injected incrementally after negative aspiration. Local anesthetic spread visualized around nerve(s). Vascular puncture avoided. No complications. Image printed for medical record.The patient tolerated the procedure well.

## 2016-06-25 ENCOUNTER — Other Ambulatory Visit: Payer: Self-pay | Admitting: Internal Medicine

## 2016-06-25 DIAGNOSIS — Z1231 Encounter for screening mammogram for malignant neoplasm of breast: Secondary | ICD-10-CM

## 2016-07-14 ENCOUNTER — Ambulatory Visit
Admission: RE | Admit: 2016-07-14 | Discharge: 2016-07-14 | Disposition: A | Discharge status: Home / Self Care | Payer: Medicare Other | Source: Ambulatory Visit | Attending: Internal Medicine | Admitting: Internal Medicine

## 2016-07-14 DIAGNOSIS — Z1231 Encounter for screening mammogram for malignant neoplasm of breast: Secondary | ICD-10-CM

## 2017-06-10 ENCOUNTER — Other Ambulatory Visit: Payer: Self-pay | Admitting: Internal Medicine

## 2017-06-10 DIAGNOSIS — Z1231 Encounter for screening mammogram for malignant neoplasm of breast: Secondary | ICD-10-CM

## 2017-07-20 ENCOUNTER — Ambulatory Visit
Admission: RE | Admit: 2017-07-20 | Discharge: 2017-07-20 | Disposition: A | Discharge status: Home / Self Care | Payer: Medicare Other | Source: Ambulatory Visit | Attending: Internal Medicine | Admitting: Internal Medicine

## 2017-07-20 DIAGNOSIS — Z1231 Encounter for screening mammogram for malignant neoplasm of breast: Secondary | ICD-10-CM

## 2017-12-08 ENCOUNTER — Other Ambulatory Visit (HOSPITAL_COMMUNITY): Payer: Self-pay | Admitting: Internal Medicine

## 2017-12-08 ENCOUNTER — Ambulatory Visit (HOSPITAL_COMMUNITY)
Admission: RE | Admit: 2017-12-08 | Discharge: 2017-12-08 | Disposition: A | Discharge status: Home / Self Care | Payer: Medicare Other | Source: Ambulatory Visit | Attending: Vascular Surgery | Admitting: Vascular Surgery

## 2017-12-08 DIAGNOSIS — I872 Venous insufficiency (chronic) (peripheral): Secondary | ICD-10-CM | POA: Diagnosis not present

## 2017-12-08 DIAGNOSIS — R600 Localized edema: Secondary | ICD-10-CM | POA: Diagnosis not present

## 2017-12-08 DIAGNOSIS — R2241 Localized swelling, mass and lump, right lower limb: Secondary | ICD-10-CM | POA: Diagnosis present

## 2017-12-08 DIAGNOSIS — M25561 Pain in right knee: Secondary | ICD-10-CM | POA: Insufficient documentation

## 2018-01-18 ENCOUNTER — Encounter: Payer: Self-pay | Admitting: Neurology

## 2018-02-11 ENCOUNTER — Ambulatory Visit (INDEPENDENT_AMBULATORY_CARE_PROVIDER_SITE_OTHER): Payer: Medicare Other | Admitting: Neurology

## 2018-02-11 ENCOUNTER — Encounter: Payer: Self-pay | Admitting: Neurology

## 2018-02-11 VITALS — BP 100/70 | HR 83 | Ht 67.0 in | Wt 148.1 lb

## 2018-02-11 DIAGNOSIS — M5416 Radiculopathy, lumbar region: Secondary | ICD-10-CM | POA: Insufficient documentation

## 2018-02-11 DIAGNOSIS — M792 Neuralgia and neuritis, unspecified: Secondary | ICD-10-CM

## 2018-02-11 MED ORDER — GABAPENTIN 300 MG PO CAPS: ORAL_CAPSULE | ORAL | 5 refills | Status: DC

## 2018-02-11 NOTE — Patient Instructions (Addendum)
Start gabapentin 300mg  at bedtime for one week, then increase to one tablet twice daily  MRI lumbar spine without contrast
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
## 2018-02-16 ENCOUNTER — Ambulatory Visit
Admission: RE | Admit: 2018-02-16 | Discharge: 2018-02-16 | Disposition: A | Discharge status: Home / Self Care | Payer: Medicare Other | Source: Ambulatory Visit | Attending: Neurology | Admitting: Neurology

## 2018-02-16 DIAGNOSIS — M5416 Radiculopathy, lumbar region: Secondary | ICD-10-CM

## 2018-02-16 DIAGNOSIS — M792 Neuralgia and neuritis, unspecified: Secondary | ICD-10-CM

## 2018-02-18 ENCOUNTER — Telehealth: Payer: Self-pay | Admitting: Neurology

## 2018-02-18 NOTE — Telephone Encounter (Signed)
Results of MRI lumbar spine discussed with patient which shows multilevel degenerative changes with severe spinal stenosis at L4-5 with left foraminal stenosis and disc protrusion at L5-S1 impinging the right S1 nerve. There is also L5 subacute endplate fracture.   I recommended seeing a spine surgeon for their opinion, given the severity of her spinal stenosis. However, patient reports her radicular pain and foot numbness is improving slowly and she is mostly bothered by posterior knee pain due to Baker's cyst.  She is seeing Dr. Janey GreaserKendal, her orthopeadic surgeon for knee pain next week who may drain the cyst.    If her radicular pain does not improve or gets worse, the next step is epidural steroid injection with radiology and/or referral to spine specialist.   All questions were answered.   MRI lumbar spine 02/17/2018:  1. Diffuse facet and disc degeneration that has progressed from 2010. 2. L4-5 severe facet degeneration with bilateral active facet arthritis and anterolisthesis. Severe spinal stenosis at this level. Left foraminal impingement exacerbated by periarticular inflammation. 3. L3-4 moderate spinal stenosis. 4. L5-S1 chronic right paracentral protrusion impinging on the right S1 nerve root. 5. L5 superior endplate fracture which may be subacute  Onna Nodal K. Allena KatzPatel, DO

## 2018-06-14 ENCOUNTER — Other Ambulatory Visit: Payer: Self-pay | Admitting: Internal Medicine

## 2018-06-14 DIAGNOSIS — Z1231 Encounter for screening mammogram for malignant neoplasm of breast: Secondary | ICD-10-CM

## 2018-07-29 ENCOUNTER — Ambulatory Visit
Admission: RE | Admit: 2018-07-29 | Discharge: 2018-07-29 | Disposition: A | Discharge status: Home / Self Care | Payer: Medicare Other | Source: Ambulatory Visit | Attending: Internal Medicine | Admitting: Internal Medicine

## 2018-07-29 DIAGNOSIS — Z1231 Encounter for screening mammogram for malignant neoplasm of breast: Secondary | ICD-10-CM

## 2019-06-12 ENCOUNTER — Other Ambulatory Visit: Payer: Self-pay | Admitting: Internal Medicine

## 2019-06-12 DIAGNOSIS — Z1231 Encounter for screening mammogram for malignant neoplasm of breast: Secondary | ICD-10-CM

## 2019-07-31 ENCOUNTER — Other Ambulatory Visit: Payer: Self-pay

## 2019-07-31 ENCOUNTER — Ambulatory Visit
Admission: RE | Admit: 2019-07-31 | Discharge: 2019-07-31 | Disposition: A | Discharge status: Home / Self Care | Payer: Medicare Other | Source: Ambulatory Visit | Attending: Internal Medicine | Admitting: Internal Medicine

## 2019-07-31 DIAGNOSIS — Z1231 Encounter for screening mammogram for malignant neoplasm of breast: Secondary | ICD-10-CM

## 2020-06-25 ENCOUNTER — Other Ambulatory Visit: Payer: Self-pay | Admitting: Internal Medicine

## 2020-06-25 DIAGNOSIS — Z1231 Encounter for screening mammogram for malignant neoplasm of breast: Secondary | ICD-10-CM

## 2020-08-06 ENCOUNTER — Ambulatory Visit: Payer: Medicare Other

## 2020-08-21 ENCOUNTER — Ambulatory Visit: Payer: Medicare Other

## 2020-09-02 ENCOUNTER — Ambulatory Visit: Payer: Medicare Other

## 2020-10-10 ENCOUNTER — Other Ambulatory Visit: Payer: Self-pay

## 2020-10-10 ENCOUNTER — Ambulatory Visit
Admission: RE | Admit: 2020-10-10 | Discharge: 2020-10-10 | Disposition: A | Discharge status: Home / Self Care | Payer: Medicare Other | Source: Ambulatory Visit | Attending: Internal Medicine | Admitting: Internal Medicine

## 2020-10-10 DIAGNOSIS — Z1231 Encounter for screening mammogram for malignant neoplasm of breast: Secondary | ICD-10-CM

## 2021-01-03 ENCOUNTER — Other Ambulatory Visit: Payer: Self-pay | Admitting: Internal Medicine

## 2021-01-03 DIAGNOSIS — R42 Dizziness and giddiness: Secondary | ICD-10-CM

## 2021-01-06 ENCOUNTER — Ambulatory Visit
Admission: RE | Admit: 2021-01-06 | Discharge: 2021-01-06 | Disposition: A | Discharge status: Home / Self Care | Payer: Medicare Other | Source: Ambulatory Visit | Attending: Internal Medicine | Admitting: Internal Medicine

## 2021-01-06 DIAGNOSIS — R42 Dizziness and giddiness: Secondary | ICD-10-CM

## 2021-01-13 ENCOUNTER — Ambulatory Visit: Payer: Medicare Other | Attending: Internal Medicine | Admitting: Physical Therapy

## 2021-01-13 ENCOUNTER — Other Ambulatory Visit: Payer: Self-pay

## 2021-01-13 ENCOUNTER — Encounter: Payer: Self-pay | Admitting: Physical Therapy

## 2021-01-13 DIAGNOSIS — R42 Dizziness and giddiness: Secondary | ICD-10-CM

## 2021-01-13 DIAGNOSIS — H8112 Benign paroxysmal vertigo, left ear: Secondary | ICD-10-CM

## 2021-01-13 DIAGNOSIS — R2681 Unsteadiness on feet: Secondary | ICD-10-CM

## 2021-01-13 NOTE — Therapy (Signed)
OUTPATIENT PHYSICAL THERAPY VESTIBULAR EVALUATION   Patient Name: Janet Brown MRN: [id number] DOB:1945/09/26, 75 y.o., female Today's Date: 01/13/2021  PCP: Creola Corn, MD REFERRING PROVIDER: Creola Corn, MD   PT End of Session - 01/13/21 1321    Visit Number 1    Number of Visits 7    Date for PT Re-Evaluation 03/14/21    Authorization Type [employer] Vertigo; $30 copay    PT Start Time 1230    PT Stop Time 1315    PT Time Calculation (min) 45 min    Activity Tolerance Patient tolerated treatment well    Behavior During Therapy [facility] for tasks assessed/performed           Past Medical History:  Diagnosis Date  . Bleeds easily (HCC)   . DDD (degenerative disc disease), cervical    arthritis - Shoulder  . Diverticulitis   . Eczema   . HNP (herniated nucleus pulposus), lumbar    per patient- doesnt have much pain  . Hypothyroidism   . Neuromuscular disorder (HCC)    "bad nerve right arm"   Past Surgical History:  Procedure Laterality Date  . CHOLECYSTECTOMY    . COLONOSCOPY    . OPEN REDUCTION INTERNAL FIXATION (ORIF) DISTAL RADIAL FRACTURE Right 12/21/2015   Procedure: RIGHT OPEN REDUCTION INTERNAL FIXATION (ORIF) DISTAL RADIAL FRACTURE AND REPAIR AS NECESSARY;  Surgeon: Bradly Bienenstock, MD;  Location: [facility] OR;  Service: Orthopedics;  Laterality: Right;   Patient Active Problem List   Diagnosis Date Noted  . Lumbar radiculopathy, right 02/11/2018    ONSET DATE: 01/06/21  REFERRING DIAG: Dizzy Spells  THERAPY DIAG:  BPPV (benign paroxysmal positional vertigo), left  Dizziness and giddiness  Unsteadiness on feet  SUBJECTIVE:   SUBJECTIVE STATEMENT: Pt reports vertigo started 3 weeks ago - pt reports when she would lie down or get OOB she felt like her head was moving.  Pt reports also having difficulty maintaining her balance when standing and walking and had to use her cane.  This is patient's first episode of vertigo.  Pt reports now vertigo has resolved but  still feels off balance.  No longer using cane but is not back to baseline.  Pt reports before episode of vertigo pt was ill with lymph nodes swelling; no fever.                                                                                                                                                                                                             PERTINENT HISTORY: DDD  cervical, OA shoulder, HNP lumbar, hypothyroidism, neuromuscular disorder, R ORIF distal radial fracture  PAIN:  Are you having pain? No  PRECAUTIONS: Other: DDD cervical, OA shoulder, HNP lumbar, hypothyroidism, neuromuscular disorder, R ORIF distal radial fracture  WEIGHT BEARING RESTRICTIONS No  FALLS: Has patient fallen in last 6 months? No,   LIVING ENVIRONMENT: Lives with: lives with their family and lives with their spouse Lives in: House/apartment Stairs: Yes; External: 1 steps; Rail on NONE going up Has following equipment at home: Single point cane  PLOF: Independent and husband is mainly driving patient aroud longer distances, pt drives locally  PATIENT GOALS Work on balance  OBJECTIVE:   DIAGNOSTIC FINDINGS: CT scan unremarkable    SENSATION: Light touch: Deficits Pt reports changes in light touch in bilat lower leg and pt reports intermittent R foot goes numb   Pt reports pain and swelling in back of RUE in tricep area; upon palpation pt has small trigger point in tricep   Cervical AROM/PROM:  Pt slightly guarded with extension due to fear of dizziness.  STRENGTH: Bilat LE 4-/5 overall; R slightly weaker than LLE  GAIT: Gait pattern: step through pattern, decreased step length- Right, decreased step length- Left and decreased trunk rotation Distance walked: 115 Assistive device utilized: None Level of assistance: SBA Comments: very guarded with gait due to imbalance; not using cane today  PATIENT SURVEYS:  FOTO DPS: 63.6 and DFS: 49%   VESTIBULAR ASSESSMENT   GENERAL  OBSERVATION: Walking slowly and guarded    SYMPTOM BEHAVIOR:   Subjective history: See history above   Non-Vestibular symptoms: changes in hearing and tinnitus   Type of dizziness: Imbalance (Disequilibrium) and Spinning/Vertigo   Frequency: has not occurred this week but was intermittent   Duration: less than a minute   Aggravating factors: Induced by position change: lying supine and supine to sit   Relieving factors: slow movements   Progression of symptoms: better   OCULOMOTOR EXAM:   Ocular Alignment: abnormal and  pt reports having a L "Lazy eye" with poor vision.   Ocular ROM: L eye limited ROM   Spontaneous Nystagmus: absent   Gaze-Induced Nystagmus: absent   Smooth Pursuits: WFL based on patient history   Saccades: intact     VESTIBULAR - OCULAR REFLEX:    Slow VOR: Comment: neck guarding   VOR Cancellation: Comment: neck guarding   Head-Impulse Test: HIT Right: positive; Left: negative    POSITIONAL TESTING: Left Dix-Hallpike: upbeating, left nystagmus; Duration: 3 seconds  Right Roll Test: none; Duration: 0  Left Roll Test: none; Duration: 0    VESTIBULAR TREATMENT:  Canalith Repositioning:   Epley Left: Number of Reps: 1 and Response to Treatment: symptoms improved  PATIENT EDUCATION: Education details: clinical findings, PT POC and goals, reason why MD referred pt to neuro instead of ortho clinic near her house Person educated: Patient Education method: Explanation Education comprehension: verbalized understanding  ASSESSMENT:  CLINICAL IMPRESSION: Patient is a 75 y.o. female who was seen today for physical therapy evaluation and treatment for dizzy spells. Objective impairments include decreased balance, difficulty walking and dizziness. These impairments are limiting patient from community activity and driving. Personal factors including Transportation and 3+ comorbidities: DDD cervical, OA shoulder, HNP lumbar, hypothyroidism, neuromuscular disorder, R ORIF  distal radial fracture due to fall, cognitive impairments are also affecting patient's functional outcome. Patient will benefit from skilled PT to address above impairments and improve overall function.  REHAB POTENTIAL: Good  CLINICAL DECISION MAKING: Stable/uncomplicated  EVALUATION COMPLEXITY: Low   GOALS: Goals reviewed with patient? Yes  SHORT TERM GOALS:  STG Name Target Date Goal status  1 Pt will perform further assessment of vestibular system and balance.  Baseline: 01/27/2021  INITIAL  2 Pt will initiate vestibular and balance HEP Baseline:  01/27/2021  INITIAL  3 Pt will tolerate treatment of BPPV and will demonstrate negative positional testing for all canals  Baseline: L Posterior canal BPPV 01/27/2021  INITIAL                       LONG TERM GOALS:   LTG Name Target Date Goal status  1 Pt will perform final HEP with supervision Baseline: 02/10/2021  INITIAL  2 Pt will increase FOTO scores to 59 for Functional status and will increase positional status by 5 points  Baseline: DFS: 49; DPS: 63.6 02/10/2021  INITIAL  3 Pt will report no dizziness with bed mobility or with looking up at the ceiling Baseline: 02/10/2021  INITIAL  4 Pt will increase FGA by 4 points to indicate decreased falls risk  Baseline: TBA 02/10/2021  INITIAL                  PLAN: PT FREQUENCY: 1x/week  PT DURATION: 8 weeks  PLANNED INTERVENTIONS: Therapeutic exercises, Therapeutic activity, Neuro Muscular re-education, Balance training, Gait training, Patient/Family education, Joint mobilization, Stair training, Vestibular training and Canalith repositioning  PLAN FOR NEXT SESSION: Re-check for L BPPV and treat if indicated.  If clear, check for R BPPV, treat if indicated.  Assess MSQ and FGA, re set LTG if needed.  Initiate HEP - pt has cognitive issues so may need to demo to husband for safety.   Dierdre Highman, PT, DPT 01/13/21    1:36 PM    [facility] [address] [place], [place], [address] Phone: [phone]   Fax:  [phone]

## 2021-01-21 NOTE — Therapy (Signed)
OUTPATIENT PHYSICAL THERAPY VESTIBULAR TREATMENT NOTE   Patient Name: Janet Brown MRN: [id number] DOB:1945-11-13, 75 y.o., female Today's Date: 01/22/2021  PCP: Shon Baton, MD REFERRING PROVIDER: Shon Baton, MD   PT End of Session - 01/22/21 1231    Visit Number 2    Number of Visits 7    Date for PT Re-Evaluation 03/14/21    Authorization Type [employer] Vert[employer]; $30 copay    PT Start Time 1231    Activity Tolerance Patient tolerated treatment well    Behavior During Therapy [facility] for tasks assessed/performed           Past Medical History:  Diagnosis Date  . Bleeds easily ([place])   . DDD (degenerative disc disease), cervical    arthritis - Shoulder  . Diverticulitis   . Eczema   . HNP (herniated nucleus pulposus), lumbar    per patient- doesnt have much pain  . Hypothyroidism   . Neuromuscular disorder ([place])    "bad nerve right arm"   Past Surgical History:  Procedure Laterality Date  . CHOLECYSTECTOMY    . COLONOSCOPY    . OPEN REDUCTION INTERNAL FIXATION (ORIF) DISTAL RADIAL FRACTURE Right 12/21/2015   Procedure: RIGHT OPEN REDUCTION INTERNAL FIXATION (ORIF) DISTAL RADIAL FRACTURE AND REPAIR AS NECESSARY;  Surgeon: Iran Planas, MD;  Location: [place];  Service: Orthopedics;  Laterality: Right;   Patient Active Problem List   Diagnosis Date Noted  . Lumbar radiculopathy, right 02/11/2018    REFERRING DIAG: Dizzy Spells  THERAPY DIAG:  BPPV (benign paroxysmal positional vertigo), left  Dizziness and giddiness  Unsteadiness on feet  PERTINENT HISTORY: DDD cervical, OA shoulder, HNP lumbar, hypothyroidism, neuromuscular disorder, R ORIF distal radial fracture  PRECAUTIONS: Other: DDD cervical, OA shoulder, HNP lumbar, hypothyroidism, neuromuscular disorder, R ORIF distal radial fracture  SUBJECTIVE: Patient reports that she feels better since that initial evaluation. Denies any issues with the dizziness. No falls.   PAIN:  Are you having pain? No (denies  pain currently, but does have some intermittent pain in the RUE that starts in biceps region and radiates down to the elbow).   OBJECTIVE:   POSITIONAL TESTS:  Right Dix-Hallpike: none; Duration: Left Dix-Hallpike: none; Duration: Right Sidelying: none; Duration: Left Sidelying: none; Duration:   Motion Sensitivity Quotient  Intensity: 0 = none, 1 = Lightheaded, 2 = Mild, 3 = Moderate, 4 = Severe, 5 = Vomiting  Intensity  1. Sitting to supine 0  2. Supine to L side 0  3. Supine to R side 0  4. Supine to sitting 0  5. L Hallpike-Dix 0  6. Up from L  0  7. R Hallpike-Dix 0  8. Up from R  0  9. Sitting, head  tipped to L knee 0  10. Head up from L  knee 0  11. Sitting, head  tipped to R knee 0  12. Head up from R  knee 0  13. Sitting head turns x5 0  14.Sitting head nods x5 0  15. In stance, 180  turn to L  0  16. In stance, 180  turn to R 0      [facility] PT Assessment - 01/22/21 0001      Functional Gait  Assessment   Gait assessed  Yes    Gait Level Surface Walks 20 ft in less than 7 sec but greater than 5.5 sec, uses assistive device, slower speed, mild gait deviations, or deviates 6-10 in outside of the 12 in walkway width.  Change in Gait Speed Makes only minor adjustments to walking speed, or accomplishes a change in speed with significant gait deviations, deviates 10-15 in outside the 12 in walkway width, or changes speed but loses balance but is able to recover and continue walking.    Gait with Horizontal Head Turns Performs head turns smoothly with slight change in gait velocity (eg, minor disruption to smooth gait path), deviates 6-10 in outside 12 in walkway width, or uses an assistive device.    Gait with Vertical Head Turns Performs task with slight change in gait velocity (eg, minor disruption to smooth gait path), deviates 6 - 10 in outside 12 in walkway width or uses assistive device    Gait and Pivot Turn Pivot turns safely in greater than 3 sec and  stops with no loss of balance, or pivot turns safely within 3 sec and stops with mild imbalance, requires small steps to catch balance.    Step Over Obstacle Is able to step over one shoe box (4.5 in total height) without changing gait speed. No evidence of imbalance.    Gait with Narrow Base of Support Ambulates less than 4 steps heel to toe or cannot perform without assistance.    Gait with Eyes Closed Walks 20 ft, slow speed, abnormal gait pattern, evidence for imbalance, deviates 10-15 in outside 12 in walkway width. Requires more than 9 sec to ambulate 20 ft.    Ambulating Backwards Walks 20 ft, slow speed, abnormal gait pattern, evidence for imbalance, deviates 10-15 in outside 12 in walkway width.    Steps Alternating feet, must use rail.    Total Score 15    FGA comment: 15/30          Vestibular Treatment:   Gaze Stabilization:   Initiated slow VOR x 1 Horizontal x 30 seconds; increased cues required for proper completion as patient demo difficulty with technique. Unable to maintain eyes focused on target. VOR x 1 Vertical x 30 seconds. No dizziness reported with horizontal/vertical. Withheld from initiating gaze stabilization with HEP today due to challenge with technique.   PATIENT EDUCATION: Education details: FGA Results; Resolution of BPPV  Person educated: Patient Education method: Explanation Education comprehension: verbalized understanding  ASSESSMENT:  CLINICAL IMPRESSION: Today's skilled PT session included reassesment of BPPV. No nystagmus/vertigo symptoms with any positioning testing today indicating resolution of L Posterior Canal BPPV. Assessed MSQ with no reports of dizziness. Patient scoring 15/30 on FGA today indicating increased risk for falls. Initiated VOR x 1 with significant challenge noted with technique. Will continue to address at next session.   REHAB POTENTIAL: Good  CLINICAL DECISION MAKING: Stable/uncomplicated  EVALUATION COMPLEXITY:  Low   GOALS: Goals reviewed with patient? Yes  SHORT TERM GOALS:  STG Name Target Date Goal status  1 Pt will perform further assessment of vestibular system and balance.  Baseline: Assessed on 6/1 01/27/2021  MET  2 Pt will initiate vestibular and balance HEP Baseline:  01/27/2021  INITIAL  3 Pt will tolerate treatment of BPPV and will demonstrate negative positional testing for all canals  Baseline: L Posterior canal BPPV 01/27/2021  INITIAL   LONG TERM GOALS:   LTG Name Target Date Goal status  1 Pt will perform final HEP with supervision Baseline: 02/10/2021  INITIAL  2 Pt will increase FOTO scores to 59 for Functional status and will increase positional status by 5 points  Baseline: DFS: 49; DPS: 63.6 02/10/2021  INITIAL  3 Pt will report no dizziness with  bed mobility or with looking up at the ceiling Baseline: 02/10/2021  INITIAL  4 Pt will increase FGA by 4 points to indicate decreased falls risk  Baseline: 15/30 02/10/2021  INITIAL   PLAN: PT FREQUENCY: 1x/week  PT DURATION: 8 weeks  PLANNED INTERVENTIONS: Therapeutic exercises, Therapeutic activity, Neuro Muscular re-education, Balance training, Gait training, Patient/Family education, Joint mobilization, Stair training, Vestibular training and Canalith repositioning  PLAN FOR NEXT SESSION:  Continue VOR x 1, Initiate HEP with balance and VOR - pt has cognitive issues so may need to demo to husband for safety.     Jones Bales, PT, DPT 01/22/2021, 12:33 PM    [facility] [address] [place], [place], [address] Phone: [phone]   Fax:  [phone]  Patient name: Maribeth Jiles MRN: [id number] DOB: Jul 09, 1946

## 2021-01-22 ENCOUNTER — Ambulatory Visit: Payer: Medicare Other | Attending: Internal Medicine

## 2021-01-22 ENCOUNTER — Other Ambulatory Visit: Payer: Self-pay

## 2021-01-22 DIAGNOSIS — H8112 Benign paroxysmal vertigo, left ear: Secondary | ICD-10-CM | POA: Insufficient documentation

## 2021-01-22 DIAGNOSIS — R42 Dizziness and giddiness: Secondary | ICD-10-CM | POA: Diagnosis present

## 2021-01-22 DIAGNOSIS — R2681 Unsteadiness on feet: Secondary | ICD-10-CM | POA: Diagnosis present

## 2021-01-29 ENCOUNTER — Other Ambulatory Visit: Payer: Self-pay

## 2021-01-29 ENCOUNTER — Ambulatory Visit: Payer: Medicare Other

## 2021-01-29 DIAGNOSIS — H8112 Benign paroxysmal vertigo, left ear: Secondary | ICD-10-CM | POA: Diagnosis not present

## 2021-01-29 DIAGNOSIS — R2681 Unsteadiness on feet: Secondary | ICD-10-CM

## 2021-01-29 DIAGNOSIS — R42 Dizziness and giddiness: Secondary | ICD-10-CM

## 2021-01-29 NOTE — Therapy (Signed)
OUTPATIENT PHYSICAL THERAPY VESTIBULAR TREATMENT NOTE   Patient Name: Janet Brown MRN: [id number] DOB:[birth year], 75 y.o., female Today's Date: 01/29/2021  PCP: Shon Baton, MD REFERRING PROVIDER: Shon Baton, MD   PT End of Session - 01/29/21 1302    Visit Number 3    Number of Visits 7    Date for PT Re-Evaluation 03/14/21    Authorization Type [employer] Vertigo; $30 copay    PT Start Time 1303    PT Stop Time 1347    PT Time Calculation (min) 44 min    Equipment Utilized During Treatment Gait belt    Activity Tolerance Patient tolerated treatment well    Behavior During Therapy WFL for tasks assessed/performed           Past Medical History:  Diagnosis Date  . Bleeds easily ([place])   . DDD (degenerative disc disease), cervical    arthritis - Shoulder  . Diverticulitis   . Eczema   . HNP (herniated nucleus pulposus), lumbar    per patient- doesnt have much pain  . Hypothyroidism   . Neuromuscular disorder ([place])    "bad nerve right arm"   Past Surgical History:  Procedure Laterality Date  . CHOLECYSTECTOMY    . COLONOSCOPY    . OPEN REDUCTION INTERNAL FIXATION (ORIF) DISTAL RADIAL FRACTURE Right 12/21/2015   Procedure: RIGHT OPEN REDUCTION INTERNAL FIXATION (ORIF) DISTAL RADIAL FRACTURE AND REPAIR AS NECESSARY;  Surgeon: Iran Planas, MD;  Location: [place];  Service: Orthopedics;  Laterality: Right;   Patient Active Problem List   Diagnosis Date Noted  . Lumbar radiculopathy, right 02/11/2018    REFERRING DIAG: Dizzy Spells  THERAPY DIAG:  Dizziness and giddiness  Unsteadiness on feet  PERTINENT HISTORY: DDD cervical, OA shoulder, HNP lumbar, hypothyroidism, neuromuscular disorder, R ORIF distal radial fracture  PRECAUTIONS: Other: DDD cervical, OA shoulder, HNP lumbar, hypothyroidism, neuromuscular disorder, R ORIF distal radial fracture  SUBJECTIVE: Patient reports that she doesn't feel well today, very fatigued. Reports woke up feeling good, but as the  day went on she doesn't feel as well. Denies COVID symptoms. Does have a mild headache. denies dizziness.   PAIN:  Are you having pain? Yes, Headache, unable to provide rating.   OBJECTIVE:    PT Treatment:  Corner Balance  Narrow BOS, firm: horizontal/vertical head turns x 10 reps. eyes closed 2 x 30 seconds.   Wide BOS, foam: completed eyes open x 30 seconds, then progressed to addition of  horizontal/vertical head turns x 10 reps. Eyes closed 2 x 30 seconds. Increased postural sway noted on foam surface. Educated on proper completion in corner and having husband present for safety. Handout provided and reviewed with patient.    Vestibular Treatment:   Gaze Stabilization:   slow VOR x 1 horizontal 3 x 30 seconds; increased cues required for proper completion, unable to maintain eyes focused on target initially but demo improvements with increased repetitions. No dizziness, reports improvements in HA w/ completion. VOR x 1 vertical, 3 x 30 seconds. No dizziness reported.  Educated on proper completion for home, HEP provided.   Gaze Stabilization: Sitting    Keeping eyes on target on wall 3-4 feet away, tilt head down 15-30 and move head side to side for 30 seconds. Repeat while moving head up and down for 30 seconds. Do 3 sessions per day.   Gaze Stabilization: Tip Card  1.Target must remain in focus, not blurry, and appear stationary while head is in motion. 2.Perform exercises with  small head movements (45 to either side of midline). 3.Increase speed of head motion so long as target is in focus. 4.If you wear eyeglasses, be sure you can see target through lens (therapist will give specific instructions for bifocal / progressive lenses). 5.These exercises may provoke dizziness or nausea. Work through these symptoms. If too dizzy, slow head movement slightly. Rest between each exercise. 6.Exercises demand concentration; avoid distractions. 7.For safety, perform standing  exercises close to a counter, wall, corner, or next to someone.  Feet Apart (Compliant Surface) Head Motion - Eyes Open    With eyes open, standing on compliant surface: pillow, feet shoulder width apart, move head slowly: up and down. Repeat 10 times per session. Do 2 sessions per day.  Copyright  VHI. All rights reserved.   Balance: Eyes Closed - Bilateral (Varied Surfaces)    Stand, feet shoulder width, close eyes. Maintain balance 30 seconds. Repeat 3 times. Do 5 sessions per week. Repeat on compliant surface: foam.  Copyright  VHI. All rights reserved.   PATIENT EDUCATION: Education details: Initial HEP (See Patient Instructions) Person educated: Patient Education method: Explanation, Handout, Demonstration Education comprehension: Verbalized understanding  ASSESSMENT:  CLINICAL IMPRESSION: Today's skilled PT session included assesment of patient's progress toward all STG. Able to meet all STG during session, and continue to denie vertigo/dizziness indicating resolution of BPPV. Rest of session focused on VOR x 1 seated, with increased cues required for proper completion. Initiated HEP including VOR x 1 and Corner Balance. PT educating on proper completion for safety. Reports that HA was resolved by end of session.   REHAB POTENTIAL: Good  CLINICAL DECISION MAKING: Stable/uncomplicated  EVALUATION COMPLEXITY: Low   GOALS: Goals reviewed with patient? Yes  SHORT TERM GOALS:  STG Name Target Date Goal status  1 Pt will perform further assessment of vestibular system and balance.  Baseline: Assessed on 6/1 01/27/2021  MET  2 Pt will initiate vestibular and balance HEP Baseline: initiated on 01/29/21 01/27/2021  MET  3 Pt will tolerate treatment of BPPV and will demonstrate negative positional testing for all canals  Baseline: L Posterior canal BPPV; Resolution of BPPV noted 01/27/2021  MET   LONG TERM GOALS:   LTG Name Target Date Goal status  1  Pt will perform final HEP with supervision Baseline: 02/10/2021  INITIAL  2 Pt will increase FOTO scores to 59 for Functional status and will increase positional status by 5 points  Baseline: DFS: 49; DPS: 63.6 02/10/2021  INITIAL  3 Pt will report no dizziness with bed mobility or with looking up at the ceiling Baseline: 02/10/2021  INITIAL  4 Pt will increase FGA by 4 points to indicate decreased falls risk  Baseline: 15/30 02/10/2021  INITIAL   PLAN: PT FREQUENCY: 1x/week  PT DURATION: 8 weeks  PLANNED INTERVENTIONS: Therapeutic exercises, Therapeutic activity, Neuro Muscular re-education, Balance training, Gait training, Patient/Family education, Joint mobilization, Stair training, Vestibular training and Canalith repositioning  PLAN FOR NEXT SESSION:  Review HEP. Progressed as Needed. Patient seems to want to D/C next session. Continue balance training if not.    Jones Bales, PT, DPT 01/29/2021, 1:52 PM    [facility] [address] [place], [place], [address] Phone: [phone]   Fax:  [phone]  Patient name: Janet Brown MRN: [id number] DOB: 07-14-1946

## 2021-01-29 NOTE — Patient Instructions (Addendum)
Gaze Stabilization: Sitting    Keeping eyes on target on wall 3-4 feet away, tilt head down 15-30 and move head side to side for 30 seconds. Repeat while moving head up and down for 30 seconds. Do 3 sessions per day.   Gaze Stabilization: Tip Card  1.Target must remain in focus, not blurry, and appear stationary while head is in motion. 2.Perform exercises with small head movements (45 to either side of midline). 3.Increase speed of head motion so long as target is in focus. 4.If you wear eyeglasses, be sure you can see target through lens (therapist will give specific instructions for bifocal / progressive lenses). 5.These exercises may provoke dizziness or nausea. Work through these symptoms. If too dizzy, slow head movement slightly. Rest between each exercise. 6.Exercises demand concentration; avoid distractions. 7.For safety, perform standing exercises close to a counter, wall, corner, or next to someone.  Copyright  VHI. All rights reserved.   Feet Apart (Compliant Surface) Head Motion - Eyes Open    With eyes open, standing on compliant surface: pillow, feet shoulder width apart, move head slowly: up and down. Repeat 10 times per session. Do 2 sessions per day.  Copyright  VHI. All rights reserved.   Balance: Eyes Closed - Bilateral (Varied Surfaces)    Stand, feet shoulder width, close eyes. Maintain balance 30 seconds. Repeat 3 times. Do 5 sessions per week. Repeat on compliant surface: foam.  Copyright  VHI. All rights reserved.

## 2021-02-06 ENCOUNTER — Ambulatory Visit: Payer: Medicare Other | Admitting: Physical Therapy

## 2021-02-06 ENCOUNTER — Other Ambulatory Visit: Payer: Self-pay

## 2021-02-06 DIAGNOSIS — H8112 Benign paroxysmal vertigo, left ear: Secondary | ICD-10-CM | POA: Diagnosis not present

## 2021-02-06 DIAGNOSIS — R42 Dizziness and giddiness: Secondary | ICD-10-CM

## 2021-02-06 DIAGNOSIS — R2681 Unsteadiness on feet: Secondary | ICD-10-CM

## 2021-02-06 NOTE — Therapy (Signed)
OUTPATIENT PHYSICAL THERAPY VESTIBULAR TREATMENT NOTE AND DISCHARGE SUMMARY   Patient Name: Janet Brown MRN: [id number] DOB:[birth year], 75 y.o., female Today's Date: 02/06/2021  PCP: Shon Baton, MD REFERRING PROVIDER: Shon Baton, MD   PT End of Session - 02/06/21 1322     Visit Number 4    Number of Visits 7    Date for PT Re-Evaluation 03/14/21    Authorization Type [employer]; $30 copay    PT Start Time 1152    PT Stop Time 1230    PT Time Calculation (min) 38 min    Activity Tolerance Patient tolerated treatment well    Behavior During Therapy WFL for tasks assessed/performed              Past Medical History:  Diagnosis Date   Bleeds easily ([place])    DDD (degenerative disc disease), cervical    arthritis - Shoulder   Diverticulitis    Eczema    HNP (herniated nucleus pulposus), lumbar    per patient- doesnt have much pain   Hypothyroidism    Neuromuscular disorder ([place])    "bad nerve right arm"   Past Surgical History:  Procedure Laterality Date   CHOLECYSTECTOMY     COLONOSCOPY     OPEN REDUCTION INTERNAL FIXATION (ORIF) DISTAL RADIAL FRACTURE Right 12/21/2015   Procedure: RIGHT OPEN REDUCTION INTERNAL FIXATION (ORIF) DISTAL RADIAL FRACTURE AND REPAIR AS NECESSARY;  Surgeon: Iran Planas, MD;  Location: [facility];  Service: Orthopedics;  Laterality: Right;   Patient Active Problem List   Diagnosis Date Noted   Lumbar radiculopathy, right 02/11/2018    REFERRING DIAG: Dizzy Spells  THERAPY DIAG:  Dizziness and giddiness  Unsteadiness on feet  PERTINENT HISTORY: DDD cervical, OA shoulder, HNP lumbar, hypothyroidism, neuromuscular disorder, R ORIF distal radial fracture  PRECAUTIONS: Other: DDD cervical, OA shoulder, HNP lumbar, hypothyroidism, neuromuscular disorder, R ORIF distal radial fracture  SUBJECTIVE: No further dizziness.  Still moving more cautiously.  Had to stop one of the exercises because of HA and neck pain.   PAIN:  Are you  having pain? No   OBJECTIVE:    PT Treatment:    [facility] PT Assessment - 02/06/21 1210       Observation/Other Assessments   Focus on Therapeutic Outcomes (FOTO)  DFS: 61, DPS: same score 63.6      Functional Gait  Assessment   Gait assessed  Yes    Gait Level Surface Walks 20 ft, slow speed, abnormal gait pattern, evidence for imbalance or deviates 10-15 in outside of the 12 in walkway width. Requires more than 7 sec to ambulate 20 ft.    Change in Gait Speed Makes only minor adjustments to walking speed, or accomplishes a change in speed with significant gait deviations, deviates 10-15 in outside the 12 in walkway width, or changes speed but loses balance but is able to recover and continue walking.    Gait with Horizontal Head Turns Performs head turns smoothly with slight change in gait velocity (eg, minor disruption to smooth gait path), deviates 6-10 in outside 12 in walkway width, or uses an assistive device.    Gait with Vertical Head Turns Performs task with slight change in gait velocity (eg, minor disruption to smooth gait path), deviates 6 - 10 in outside 12 in walkway width or uses assistive device    Gait and Pivot Turn Pivot turns safely in greater than 3 sec and stops with no loss of balance, or pivot turns safely within  3 sec and stops with mild imbalance, requires small steps to catch balance.    Step Over Obstacle Is able to step over 2 stacked shoe boxes taped together (9 in total height) without changing gait speed. No evidence of imbalance.    Gait with Narrow Base of Support Ambulates less than 4 steps heel to toe or cannot perform without assistance.    Gait with Eyes Closed Walks 20 ft, slow speed, abnormal gait pattern, evidence for imbalance, deviates 10-15 in outside 12 in walkway width. Requires more than 9 sec to ambulate 20 ft.    Ambulating Backwards Walks 20 ft, slow speed, abnormal gait pattern, evidence for imbalance, deviates 10-15 in outside 12 in walkway  width.    Steps Alternating feet, must use rail.    Total Score 15    FGA comment: 15/30 - no change              Vestibular Treatment:    Vestibular Assessment - 02/06/21 1216       Positional Testing   Sidelying Test Sidelying Right;Sidelying Left    Horizontal Canal Testing Horizontal Canal Right;Horizontal Canal Left      Sidelying Right   Sidelying Right Duration 0    Sidelying Right Symptoms No nystagmus      Sidelying Left   Sidelying Left Duration 0    Sidelying Left Symptoms No nystagmus      Horizontal Canal Right   Horizontal Canal Right Duration 0    Horizontal Canal Right Symptoms Normal      Horizontal Canal Left   Horizontal Canal Left Duration 0    Horizontal Canal Left Symptoms Normal      Positional Sensitivities   Sit to Supine No dizziness    Supine to Left Side No dizziness    Supine to Right Side No dizziness    Supine to Sitting No dizziness    Nose to Right Knee No dizziness    Right Knee to Sitting No dizziness    Nose to Left Knee No dizziness    Left Knee to Sitting No dizziness    Head Turning x 5 No dizziness    Head Nodding x 5 No dizziness    Pivot Right in Standing No dizziness    Pivot Left in Standing No dizziness    Rolling Right No dizziness    Rolling Left No dizziness               02/06/21 1322  PT Education  Education Details progress toward goals, revision of HEP and exercises to continue to work on to address balance, D/C today, indications to return to therapy  Person(s) Educated Patient  Methods Explanation  Comprehension Verbalized understanding     ASSESSMENT:   CLINICAL IMPRESSION:   Patient has made good progress and has met 2/4 LTG.  Pt demonstrates full resolution of vertigo and no longer experiences dizziness with positional changes.  Despite this improvement, pt's FOTO Positional Status score did not change but pt did report significant improvement in her overall function.  FOTO goal was  partially met.  Pt did not meet FGA goal; score remained the same.  Pt has been issued a balance HEP that she will continue at D/C.  Pt is pleased with progress and has requested to D/C from therapy at this time.     GOALS: Goals reviewed with patient? Yes   SHORT TERM GOALS:   STG Name Target Date Goal status  1 Pt will  perform further assessment of vestibular system and balance.   Baseline: Assessed on 6/1 01/27/2021   MET  2 Pt will initiate vestibular and balance HEP Baseline: initiated on 01/29/21 01/27/2021   MET  3 Pt will tolerate treatment of BPPV and will demonstrate negative positional testing for all canals   Baseline: L Posterior canal BPPV; Resolution of BPPV noted 01/27/2021   MET    LONG TERM GOALS:    LTG Name Target Date Goal status  1 Pt will perform final HEP with supervision Baseline: 02/10/2021   Met  2 Pt will increase FOTO scores to 59 for Functional status and will increase positional status by 5 points   Baseline: DFS: 49; DPS: 63.6 02/10/2021   Partially met  3 Pt will report no dizziness with bed mobility or with looking up at the ceiling Baseline: 02/10/2021   Met  4 Pt will increase FGA by 4 points to indicate decreased falls risk   Baseline: 15/30 no change 02/10/2021   Not Met    PLAN: PT FREQUENCY: 1x/week   PT DURATION: 8 weeks   PLANNED INTERVENTIONS: Therapeutic exercises, Therapeutic activity, Neuro Muscular re-education, Balance training, Gait training, Patient/Family education, Joint mobilization, Stair training, Vestibular training and Canalith repositioning   PLAN FOR NEXT SESSION:  D/C    PHYSICAL THERAPY DISCHARGE SUMMARY  Visits from Start of Care: 4  Current functional level related to goals / functional outcomes: See goal achievement and impression statement above   Remaining deficits: imbalance   Education / Equipment: HEP   Patient agrees to discharge. Patient goals were partially met. Patient is being discharged due  to being pleased with the current functional level.   Rico Junker, PT, DPT 02/06/2021, 1:23 PM    [facility] [address] [place], [place], [address] Phone: [phone]   Fax:  [phone]  Patient name: Janet Brown MRN: [id number] DOB: 1945-10-16

## 2021-02-06 NOTE — Patient Instructions (Signed)
Feet Apart (Compliant Surface) Head Motion - Eyes Open    With eyes open, standing on compliant surface: pillow, feet shoulder width apart, move head slowly: up and down. Repeat 10 times per session. Do 2 sessions per day.  Copyright  VHI. All rights reserved.   Balance: Eyes Closed - Bilateral (Varied Surfaces)    Stand, feet shoulder width, close eyes. Maintain balance 30 seconds. Repeat 3 times. Do 5 sessions per week. Repeat on compliant surface: foam.  Copyright  VHI. All rights reserved.

## 2021-02-11 ENCOUNTER — Encounter: Payer: Medicare Other | Admitting: Physical Therapy

## 2021-02-13 ENCOUNTER — Ambulatory Visit: Payer: Medicare Other

## 2021-02-20 ENCOUNTER — Encounter: Payer: Medicare Other | Admitting: Physical Therapy

## 2021-02-26 ENCOUNTER — Other Ambulatory Visit: Payer: Self-pay

## 2021-02-26 ENCOUNTER — Encounter (HOSPITAL_BASED_OUTPATIENT_CLINIC_OR_DEPARTMENT_OTHER): Payer: Self-pay

## 2021-02-26 ENCOUNTER — Emergency Department (HOSPITAL_BASED_OUTPATIENT_CLINIC_OR_DEPARTMENT_OTHER)
Admission: EM | Admit: 2021-02-26 | Discharge: 2021-02-26 | Disposition: A | Discharge status: Home / Self Care | Payer: Medicare Other | Attending: Emergency Medicine | Admitting: Emergency Medicine

## 2021-02-26 DIAGNOSIS — S0990XA Unspecified injury of head, initial encounter: Secondary | ICD-10-CM | POA: Diagnosis present

## 2021-02-26 DIAGNOSIS — E039 Hypothyroidism, unspecified: Secondary | ICD-10-CM | POA: Insufficient documentation

## 2021-02-26 DIAGNOSIS — S0083XA Contusion of other part of head, initial encounter: Secondary | ICD-10-CM | POA: Diagnosis not present

## 2021-02-26 DIAGNOSIS — W01198A Fall on same level from slipping, tripping and stumbling with subsequent striking against other object, initial encounter: Secondary | ICD-10-CM | POA: Insufficient documentation

## 2021-02-26 DIAGNOSIS — Y92002 Bathroom of unspecified non-institutional (private) residence single-family (private) house as the place of occurrence of the external cause: Secondary | ICD-10-CM | POA: Insufficient documentation

## 2021-02-26 DIAGNOSIS — Z79899 Other long term (current) drug therapy: Secondary | ICD-10-CM | POA: Diagnosis not present

## 2021-02-26 NOTE — Discharge Instructions (Addendum)
Read over the information included.  If you have new symptoms like we discussed, please return to the ER.  We talked about a CT scan of your brain.  You decided not to have it at this time.  If your symptoms worsen you may need a scan to look for signs of brain bleed.

## 2021-02-26 NOTE — ED Notes (Signed)
Patient verbalizes understanding of discharge instructions. Opportunity for questioning and answers were provided. Patient discharged from ED.  °

## 2021-02-26 NOTE — ED Triage Notes (Signed)
He states that she tripped (didn't pass out) at her home yesterday and fell forward. Initially, her only discomfort was of left foot. She saw a provider and was treated for "a broken toe". She noted this morning a "sore area" at ant. Upper right forehead area. She is in no distress. Very minimal, non-discolored edema at her forehead area. She is awake, alert and oriented x 4 with clear speech.

## 2021-02-26 NOTE — ED Provider Notes (Signed)
[facility] EMERGENCY DEPT Provider Note   CSN: [id number] Arrival date & time: 02/26/21  1418     History Chief Complaint  Patient presents with   Head Injury    Janet Brown is a 75 y.o. female presenting to the ED with head injury.  She tripped in the bathroom yesterday and struck her forehead on the edge of the tub.  No LOC. Not on A/C.  No neck pain.  No headache.  HPI     Past Medical History:  Diagnosis Date   Bleeds easily ([facility])    DDD (degenerative disc disease), cervical    arthritis - Shoulder   Diverticulitis    Eczema    HNP (herniated nucleus pulposus), lumbar    per patient- doesnt have much pain   Hypothyroidism    Neuromuscular disorder ([facility])    "bad nerve right arm"    Patient Active Problem List   Diagnosis Date Noted   Lumbar radiculopathy, right 02/11/2018    Past Surgical History:  Procedure Laterality Date   CHOLECYSTECTOMY     COLONOSCOPY     OPEN REDUCTION INTERNAL FIXATION (ORIF) DISTAL RADIAL FRACTURE Right 12/21/2015   Procedure: RIGHT OPEN REDUCTION INTERNAL FIXATION (ORIF) DISTAL RADIAL FRACTURE AND REPAIR AS NECESSARY;  Surgeon: Bradly Bienenstock, MD;  Location: [facility];  Service: Orthopedics;  Laterality: Right;     OB History   No obstetric history on file.     Family History  Problem Relation Age of Onset   Heart disease Mother    Heart disease Father    Rheum arthritis Father    Other Sister        MGUS    Social History   Tobacco Use   Smoking status: Never   Smokeless tobacco: Never  Vaping Use   Vaping Use: Never used  Substance Use Topics   Alcohol use: No   Drug use: No    Home Medications Prior to Admission medications   Medication Sig Start Date End Date Taking? Authorizing Provider  gabapentin (NEURONTIN) 300 MG capsule Start 1 tablet at bedtime for one week, then increase to 1 tablet twice daily Patient not taking: Reported on 01/13/2021 02/11/18   Nita Sickle K, DO  ibuprofen  (ADVIL,MOTRIN) 200 MG tablet Take 200 mg by mouth every 6 (six) hours as needed (pain).     [provider]  levothyroxine (SYNTHROID, LEVOTHROID) 100 MCG tablet Take 100 mcg by mouth daily before breakfast.    [provider]    Allergies    Other  Review of Systems   Review of Systems  Constitutional:  Negative for chills and fever.  Eyes:  Negative for photophobia and visual disturbance.  Respiratory:  Negative for cough and shortness of breath.   Cardiovascular:  Negative for chest pain and palpitations.  Gastrointestinal:  Negative for abdominal pain and vomiting.  Musculoskeletal:  Negative for arthralgias and myalgias.  Skin:  Negative for color change and rash.  Neurological:  Negative for syncope, light-headedness, numbness and headaches.  All other systems reviewed and are negative.  Physical Exam Updated Vital Signs BP (!) 140/92 (BP Location: Right Arm)   Pulse 75   Temp 98.3 F (36.8 C) (Oral)   Resp 16   SpO2 100%   Physical Exam Constitutional:      General: She is not in acute distress. HENT:     Head: Normocephalic. No raccoon eyes, Battle's sign or abrasion.     Comments: Small hematoma to  right forehead Eyes:     Conjunctiva/sclera: Conjunctivae normal.     Pupils: Pupils are equal, round, and reactive to light.  Cardiovascular:     Rate and Rhythm: Normal rate and regular rhythm.  Pulmonary:     Effort: Pulmonary effort is normal. No respiratory distress.  Abdominal:     General: There is no distension.     Tenderness: There is no abdominal tenderness.  Skin:    General: Skin is warm and dry.  Neurological:     General: No focal deficit present.     Mental Status: She is alert. Mental status is at baseline.  Psychiatric:        Mood and Affect: Mood normal.        Behavior: Behavior normal.    ED Results / Procedures / Treatments   Labs (all labs ordered are listed, but only abnormal results are displayed) Labs Reviewed -  No data to display  EKG None  Radiology No results found.  Procedures Procedures   Medications Ordered in ED Medications - No data to display  ED Course  I have reviewed the triage vital signs and the nursing notes.  Pertinent labs & imaging results that were available during my care of the patient were reviewed by me and considered in my medical decision making (see chart for details).  75 yo female here with minor head trauma yesterday, not on a/c, small hematoma on forehead  We discussed CT imaging of the head vs watchful waiting - she prefers to monitor her sx at home, where she lives with her husband.  I think this is reasonable.  No signs of significant head injury, basilar skull fx, confusion, or neurological deficits.  I have a fairly low suspicion for ICH.  Doubt spinal fracture or other traumatic injures.  Return precautions reviewed with patient and provided in papers.     Final Clinical Impression(s) / ED Diagnoses Final diagnoses:  Injury of head, initial encounter    Rx / DC Orders ED Discharge Orders     None        Terald Sleeper, MD 02/26/21 1512

## 2021-02-27 ENCOUNTER — Encounter: Payer: Medicare Other | Admitting: Physical Therapy

## 2021-07-22 ENCOUNTER — Other Ambulatory Visit: Payer: Self-pay | Admitting: Internal Medicine

## 2021-07-22 DIAGNOSIS — Z1231 Encounter for screening mammogram for malignant neoplasm of breast: Secondary | ICD-10-CM

## 2021-10-13 ENCOUNTER — Ambulatory Visit
Admission: RE | Admit: 2021-10-13 | Discharge: 2021-10-13 | Disposition: A | Discharge status: Home / Self Care | Payer: Medicare Other | Source: Ambulatory Visit | Attending: Internal Medicine | Admitting: Internal Medicine

## 2021-10-13 DIAGNOSIS — Z1231 Encounter for screening mammogram for malignant neoplasm of breast: Secondary | ICD-10-CM

## 2022-04-28 IMAGING — CT CT HEAD W/O CM
1 series · 16 of 30 positions shown, 20 images · non-contrast
Comparison: None.

CLINICAL DATA: Dizziness and vertigo.  Unsteady gait.

EXAM:
CT HEAD WITHOUT CONTRAST
TECHNIQUE: Contiguous axial images were obtained from the base of the skull
through the vertex without intravenous contrast.

[Series 2: head w/(date) · axial · 0.42mm/px · z∈[-190,-40]mm · 16 of 34 slices shown, 20 images]
[im 2/34  brain]
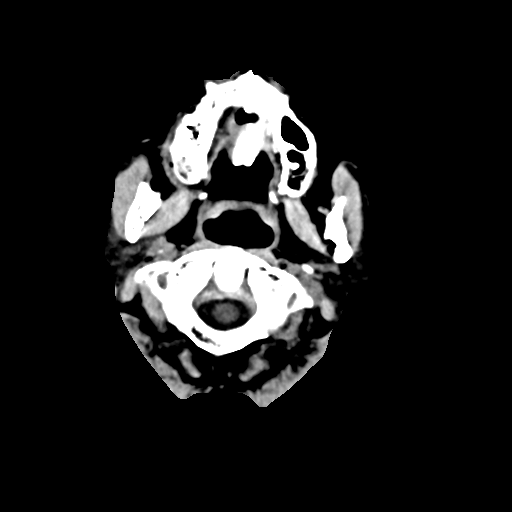
[im 2/34  bone]
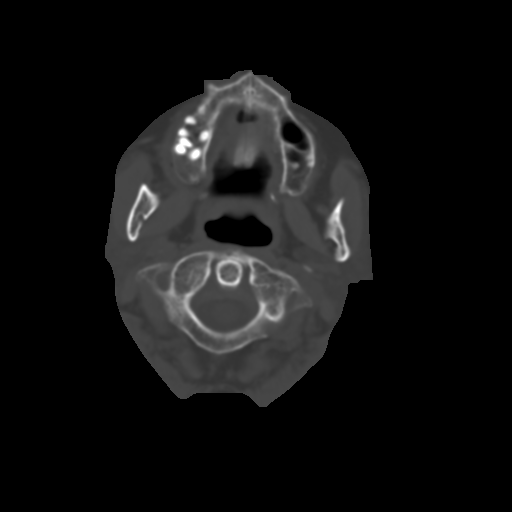
[im 4/34  brain]
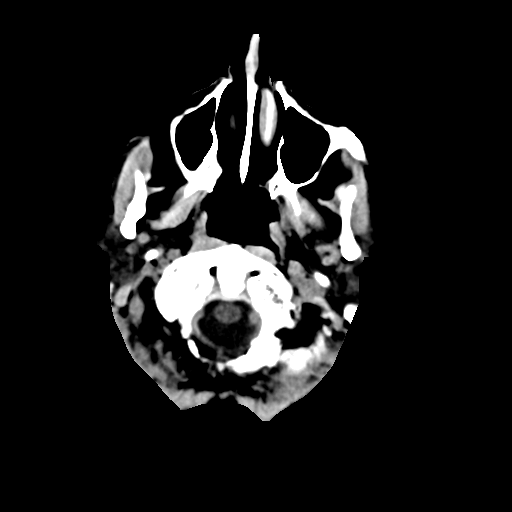
[im 6/34  brain]
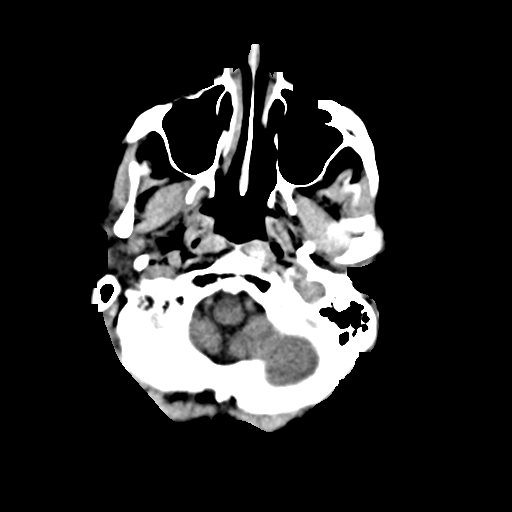
[im 8/34  brain]
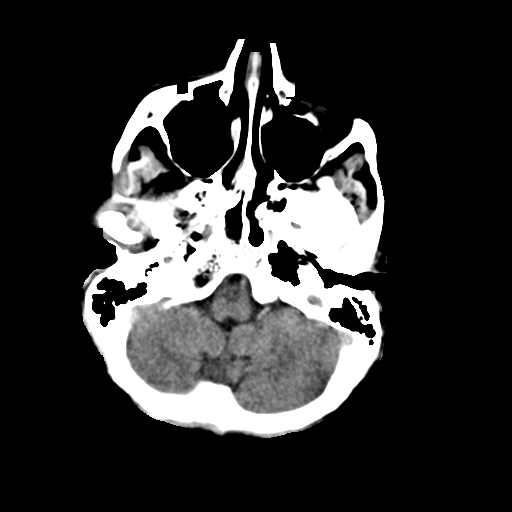
[im 10/34  brain]
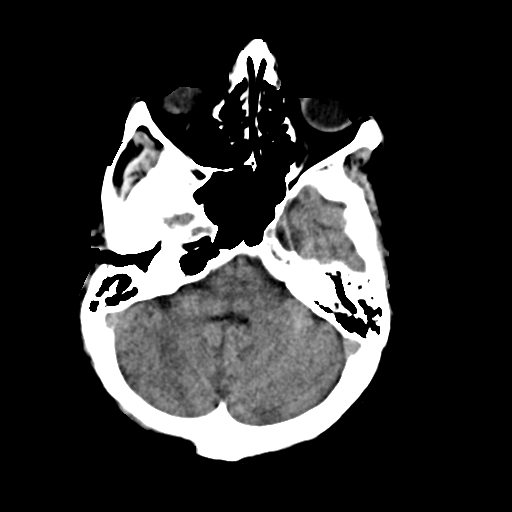
[im 10/34  bone]
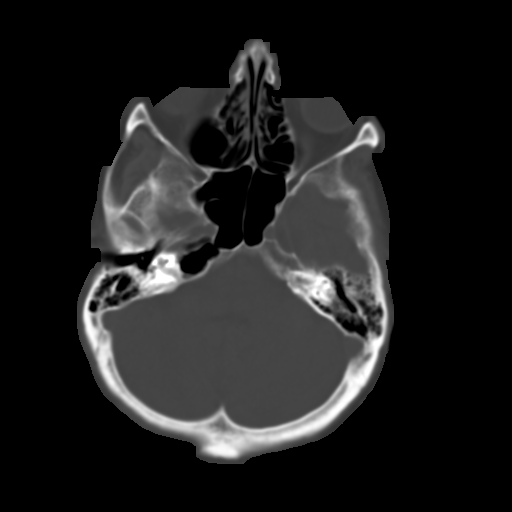
[im 12/34  brain]
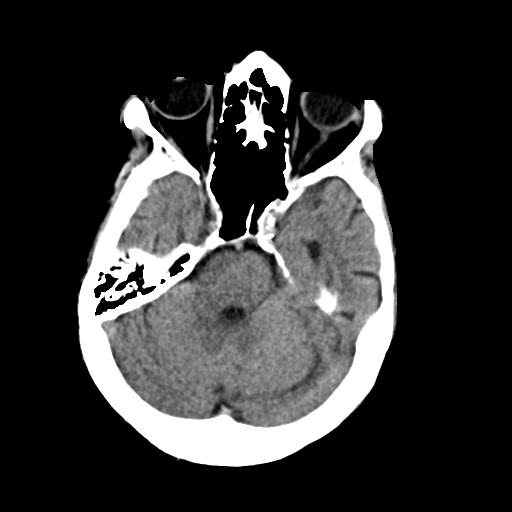
[im 14/34  brain]
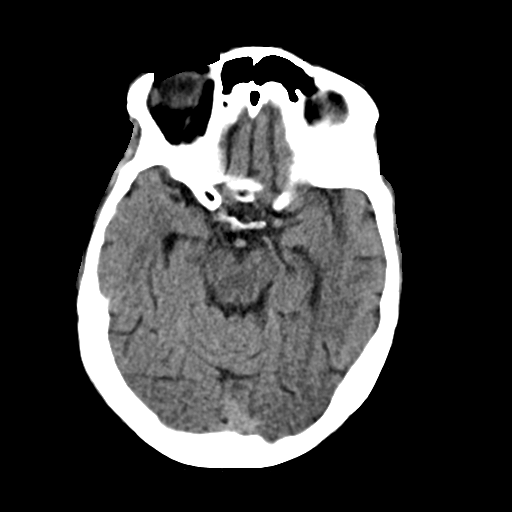
[im 16/34  brain]
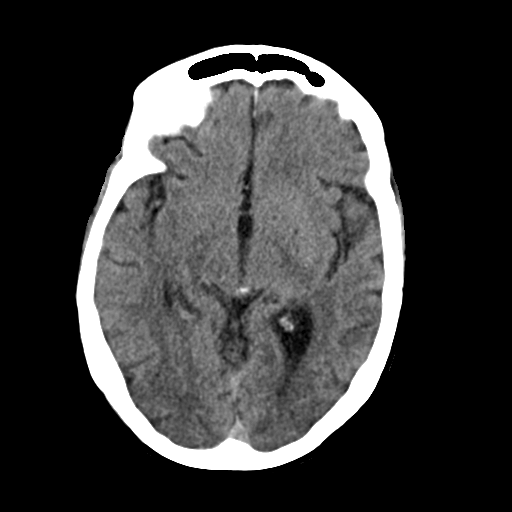
[im 18/34  brain]
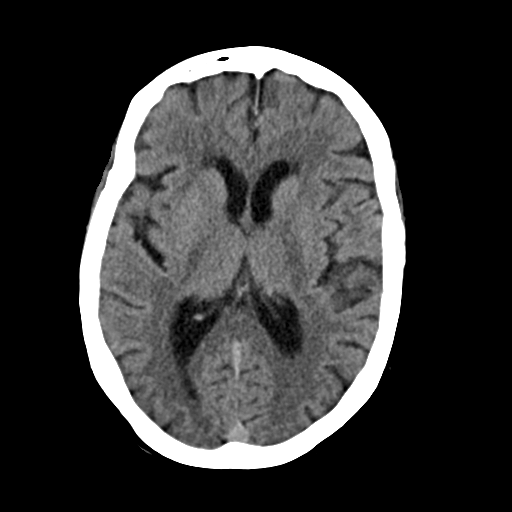
[im 18/34  bone]
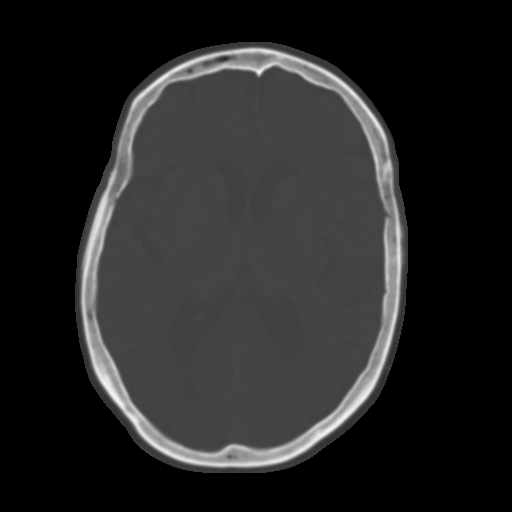
[im 20/34  brain]
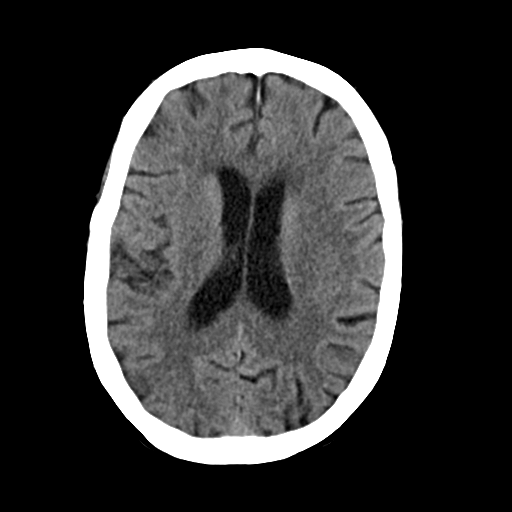
[im 22/34  brain]
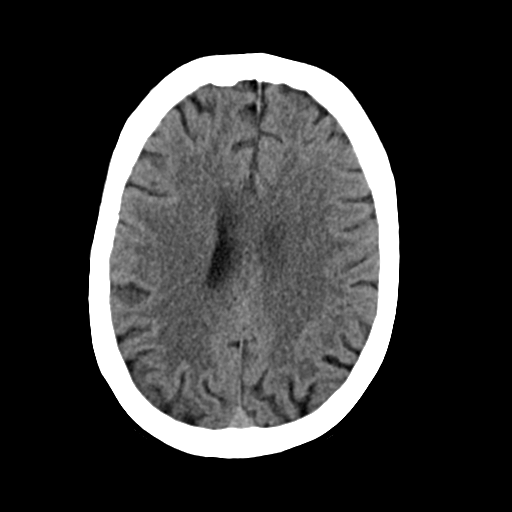
[im 24/34  brain]
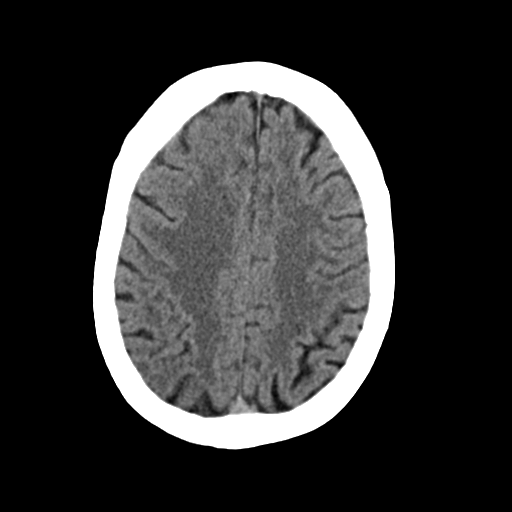
[im 26/34  brain]
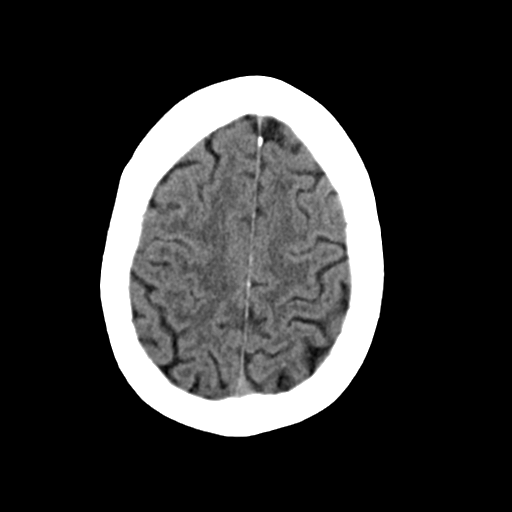
[im 26/34  bone]
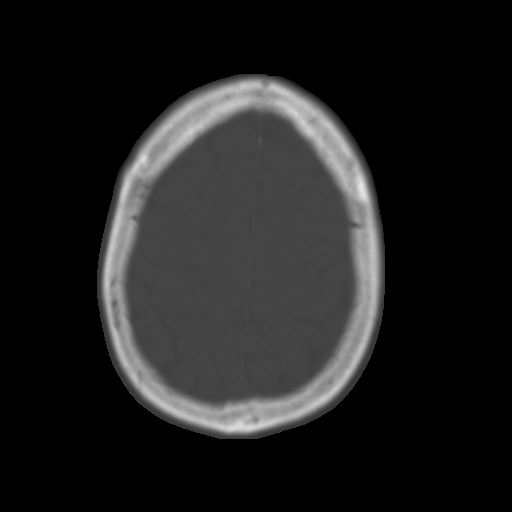
[im 28/34  brain]
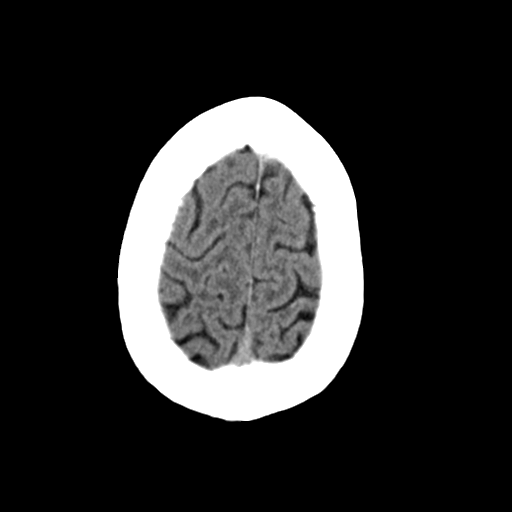
[im 30/34  brain]
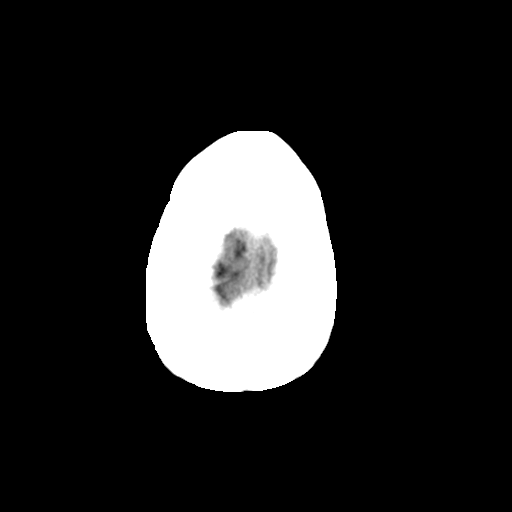
[im 32/34  brain]
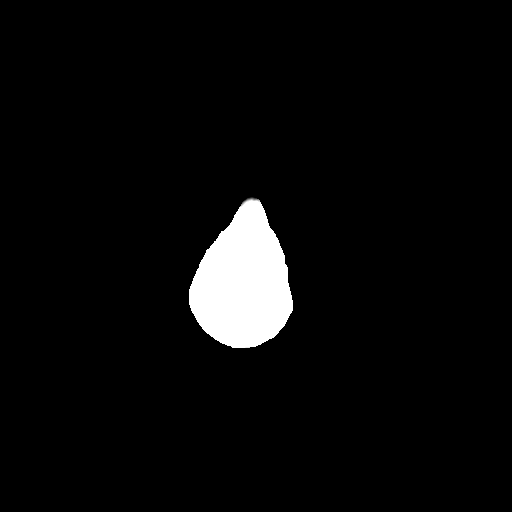

[16 of 30 positions shown; findings below may reference images not displayed]

FINDINGS: Brain: There is no evidence for acute hemorrhage, hydrocephalus,
mass lesion, or abnormal extra-axial fluid collection. No definite
CT evidence for acute infarction.

Vascular: No hyperdense vessel or unexpected calcification.

Skull: No evidence for fracture. No worrisome lytic or sclerotic
lesion.

Sinuses/Orbits: The visualized paranasal sinuses and mastoid air
cells are clear. Visualized portions of the globes and intraorbital
fat are unremarkable.

Other: None.
IMPRESSION: Unremarkable study.  No acute intracranial abnormality.

## 2022-09-08 ENCOUNTER — Other Ambulatory Visit: Payer: Self-pay | Admitting: Internal Medicine

## 2022-09-08 DIAGNOSIS — Z1231 Encounter for screening mammogram for malignant neoplasm of breast: Secondary | ICD-10-CM

## 2022-09-28 ENCOUNTER — Encounter: Payer: Self-pay | Admitting: Physician Assistant

## 2022-10-15 ENCOUNTER — Encounter: Payer: Self-pay | Admitting: Physician Assistant

## 2022-10-15 ENCOUNTER — Other Ambulatory Visit (INDEPENDENT_AMBULATORY_CARE_PROVIDER_SITE_OTHER): Payer: Medicare Other

## 2022-10-15 ENCOUNTER — Ambulatory Visit: Payer: Medicare Other | Admitting: Physician Assistant

## 2022-10-15 VITALS — HR 99 | Resp 20 | Wt 140.0 lb

## 2022-10-15 DIAGNOSIS — R413 Other amnesia: Secondary | ICD-10-CM | POA: Diagnosis not present

## 2022-10-15 NOTE — Patient Instructions (Addendum)
It was a pleasure to see you today at our office.   Recommendations:  Follow up in  6 months MRI of the brain  Labs today  Consider memory medicine , at lease by your primary doctor   Whom to call:  Memory  decline, memory medications: Call our office [phone]   For psychiatric meds, mood meds: Please have your primary care physician manage these medications.       For assessment of decision of mental capacity and competency:  Call Dr. Anthoney Harada, geriatric psychiatrist at [phone]     If you have any severe symptoms of a stroke, or other severe issues such as confusion,severe chills or fever, etc call 911 or go to the ER as you may need to be evaluated further   Feel free to visit Facebook page " [email or web address]" for tips of how to care for people with memory problems.      RECOMMENDATIONS FOR ALL PATIENTS WITH MEMORY PROBLEMS: 1. Continue to exercise (Recommend 30 minutes of walking everyday, or 3 hours every week) 2. Increase social interactions - continue going to [place] and enjoy social gatherings with friends and family 3. Eat healthy, avoid fried foods and eat more fruits and vegetables 4. Maintain adequate blood pressure, blood sugar, and blood cholesterol level. Reducing the risk of stroke and cardiovascular disease also helps promoting better memory. 5. Avoid stressful situations. Live a simple life and avoid aggravations. Organize your time and prepare for the next day in anticipation. 6. Sleep well, avoid any interruptions of sleep and avoid any distractions in the bedroom that may interfere with adequate sleep quality 7. Avoid sugar, avoid sweets as there is a strong link between excessive sugar intake, diabetes, and cognitive impairment We discussed the Mediterranean diet, which has been shown to help patients reduce the risk of progressive memory disorders and reduces cardiovascular risk. This includes eating fish, eat fruits and green leafy vegetables, nuts like  almonds and hazelnuts, walnuts, and also use olive oil. Avoid fast foods and fried foods as much as possible. Avoid sweets and sugar as sugar use has been linked to worsening of memory function.  There is always a concern of gradual progression of memory problems. If this is the case, then we may need to adjust level of care according to patient needs. Support, both to the patient and caregiver, should then be put into place.    FALL PRECAUTIONS: Be cautious when walking. Scan the area for obstacles that may increase the risk of trips and falls. When getting up in the mornings, sit up at the edge of the bed for a few minutes before getting out of bed. Consider elevating the bed at the head end to avoid drop of blood pressure when getting up. Walk always in a well-lit room (use night lights in the walls). Avoid area rugs or power cords from appliances in the middle of the walkways. Use a walker or a cane if necessary and consider physical therapy for balance exercise. Get your eyesight checked regularly.  FINANCIAL OVERSIGHT: Supervision, especially oversight when making financial decisions or transactions is also recommended.  HOME SAFETY: Consider the safety of the kitchen when operating appliances like stoves, microwave oven, and blender. Consider having supervision and share cooking responsibilities until no longer able to participate in those. Accidents with firearms and other hazards in the house should be identified and addressed as well.   ABILITY TO BE LEFT ALONE: If patient is unable to contact 911 operator, consider  using [employer], or when the need is there, arrange for someone to stay with patients. Smoking is a fire hazard, consider supervision or cessation. Risk of wandering should be assessed by caregiver and if detected at any point, supervision and safe proof recommendations should be instituted.  MEDICATION SUPERVISION: Inability to self-administer medication needs to be constantly  addressed. Implement a mechanism to ensure safe administration of the medications.   Your provider has requested that you have labwork completed today. Please go to [facility] ([id number]) on the second floor of this building before leaving the office today. You do not need to check in. If you are not called within 15 minutes please check with the front desk. ..  MRI will be at [facility] [id number].

## 2022-10-15 NOTE — Progress Notes (Addendum)
Assessment/Plan:   Janet Brown is a very pleasant 77 y.o. year old RH female with a history of hypertension, hyperlipidemia, DDD with cervical arthritis and history of lumbar right radiculopathy, eczema, hypothyroidism, seen today for evaluation of memory loss. Unable to complete MoCA. MMSE was 22/30. Patient denies any memory issues, however, apathy and anosognosia may be indicating dementia, likely of Alzheimer's etiology. She declines any medications such as ACHI, and if necessary, she would prefer Dr. Virgina Jock (PCP) to manage.  Workup is in progress    Memory Impairment, likely due to Alzheimer's Disease   MRI brain without contrast to assess for underlying structural abnormality and assess vascular load  Check B12, TSH Consider ACHI as per PCP Continue to control mood as per PCP Recommend good control of cardiovascular risk factors.    Folllow up in 6 months  Subjective:   The patient is here alone   How long did patient have memory difficulties? " I was very sick for 2 months and one of those viruses from another country (could not remember the word CoVID). That was the start of it".  She denies any memory changes remembering conversations or people's names, "I am doing very well. My doctor sent me here."  repeats oneself? She denies (but repeated some sentences several times) Disoriented when walking into a room?  Denies .  Leaving objects in unusual places? denies   Wandering behavior?  denies , but "I always have another person with me. I am never alone" Any personality changes since last visit?  Patient denies   Any worsening depression?:  Patient denies   Hallucinations or paranoia?  Patient denies   Seizures?   Patient denies    Any sleep changes?" I sleep like a dog, and take naps too"  Denies vivid dreams, REM behavior or sleepwalking   Sleep apnea?  Patient denies   Any hygiene concerns?  Patient denies   Independent of bathing and dressing?  Endorsed  Does  the patient needs help with medications? Husband is in charge   Who is in charge of the finances? Husband  is in charge "took over when I started having little problems"     Any changes in appetite?   Denies, it is good.      Patient have trouble swallowing?   denies   Does the patient cook? If I want to   Any kitchen accidents such as leaving the stove on? Patient denies   Any headaches?   denies   Chronic back pain  in chart there is indication of lumbar radiculopathy but patient adamantly denies  Ambulates with difficulty?    denies   Recent falls or head injuries?   In July 2022, the patient was seen at the ER after a mechanical fall striking her forehead at the edge of the tablet without loss of consciousness. Unilateral weakness, numbness or tingling?  denies   Any tremors?   denies   Any anosmia?  denies   Any incontinence of urine?denies  Any bowel dysfunction?    denies      Patient lives  with husband  History of heavy alcohol intake? denies   History of heavy tobacco use?  denies   Family history of dementia? mother may have had dementia, father had ETOH complications  Does patient drive? Husband he held the keys, "I can drive if I want to of course, If I need to drive I would "   Past Medical History:  Diagnosis  Date   Bleeds easily ([place])    DDD (degenerative disc disease), cervical    arthritis - Shoulder   Diverticulitis    Eczema    HNP (herniated nucleus pulposus), lumbar    per patient- doesnt have much pain   Hypothyroidism    Neuromuscular disorder ([place])    "bad nerve right arm"     Past Surgical History:  Procedure Laterality Date   CHOLECYSTECTOMY     COLONOSCOPY     OPEN REDUCTION INTERNAL FIXATION (ORIF) DISTAL RADIAL FRACTURE Right 12/21/2015   Procedure: RIGHT OPEN REDUCTION INTERNAL FIXATION (ORIF) DISTAL RADIAL FRACTURE AND REPAIR AS NECESSARY;  Surgeon: Iran Planas, MD;  Location: [facility];  Service: Orthopedics;  Laterality: Right;     Allergies   Allergen Reactions   Methylprednisolone Other (See Comments)   Other     DARVOCET- dizziness hallucinations    Current Outpatient Medications  Medication Instructions   gabapentin (NEURONTIN) 300 MG capsule Start 1 tablet at bedtime for one week, then increase to 1 tablet twice daily   ibuprofen (ADVIL) 200 mg, Every 6 hours PRN   levothyroxine (SYNTHROID) 76 mcg, Oral, Daily before breakfast     VITALS:   Vitals:   10/15/22 1336  Pulse: 99  Resp: 20  SpO2: 96%  Weight: 140 lb (63.5 kg)       No data to display          PHYSICAL EXAM   HEENT:  Normocephalic, atraumatic. The mucous membranes are moist. The superficial temporal arteries are without ropiness or tenderness. Cardiovascular: Regular rate and rhythm. Lungs: Clear to auscultation bilaterally. Neck: There are no carotid bruits noted bilaterally.  NEUROLOGICAL:     No data to display             10/15/2022    2:00 PM  MMSE - Mini Mental State Exam  Orientation to time 4  Orientation to Place 5  Registration 3  Attention/ Calculation 3  Recall 0  Language- name 2 objects 1  Language- repeat 1  Language- follow 3 step command 3  Language- read & follow direction 1  Write a sentence 1  Copy design 0  Total score 22     Orientation:  Alert and oriented to person, to city, but not to place, or time. No aphasia or dysarthria. Fund of knowledge is reduced. Recent and remote memory impaired.   Attention and concentration are reduced.  Able to name objects 1/2  and repeat phrases. Delayed recall  0/3  Cranial nerves: There is good facial symmetry. Extraocular muscles are intact and visual fields are full to confrontational testing. Speech is fluent and clear. No tongue deviation. Hearing is intact to conversational tone, but comprehension seems decreased . Tone: Tone is good throughout. Sensation: Sensation is intact to light touch and pinprick throughout. Vibration is intact at the bilateral big  toe.There is no extinction with double simultaneous stimulation. There is no sensory dermatomal level identified. Coordination: The patient has no difficulty with RAM's or FNF bilaterally. Normal finger to nose  Motor: Strength is 5/5 in the bilateral upper and lower extremities. There is no pronator drift. There are no fasciculations noted. DTR's: Deep tendon reflexes are 1/4 at the bilateral biceps, triceps, brachioradialis, patella and achilles.  Plantar responses are downgoing bilaterally. Gait and Station: The patient is able to ambulate without difficulty.The patient is able to heel toe walk without any difficulty.The patient is able to ambulate in a tandem fashion. The patient is  able to stand in the Romberg position.     Thank you for allowing [place] the opportunity to participate in the care of this nice patient. Please do not hesitate to contact us for any questions or concerns.   Total time spent on today's visit was 48 minutes dedicated to this patient today, preparing to see patient, examining the patient, ordering tests and/or medications and counseling the patient, documenting clinical information in the EHR or other health record, independently interpreting results and communicating results to the patient/family, discussing treatment and goals, answering patient's questions and coordinating care.  Cc:  Shon Baton, MD  Sharene Butters 10/15/2022 2:19 PM

## 2022-10-16 LAB — TSH: TSH: 0.45 u[IU]/mL (ref 0.35–5.50)

## 2022-10-16 LAB — VITAMIN B12: Vitamin B-12: 222 pg/mL (ref 211–911)

## 2022-10-16 NOTE — Progress Notes (Signed)
B12 is low, needs 1000 mcg daily, follow with primary doctor Thyroid is normal Thanks

## 2022-10-19 ENCOUNTER — Telehealth: Payer: Self-pay | Admitting: Physician Assistant

## 2022-10-19 NOTE — Telephone Encounter (Signed)
Pt's husband called in stating he saw that the pt had neurontin on the AVS under medications. They are not familiar with that medication. She is not taking that. They don't recall ever being prescribed that medication.

## 2022-10-20 NOTE — Telephone Encounter (Signed)
Can remove next visit, thanked me for calling

## 2022-10-20 NOTE — Telephone Encounter (Signed)
Will route to sara

## 2022-10-28 ENCOUNTER — Ambulatory Visit
Admission: RE | Admit: 2022-10-28 | Discharge: 2022-10-28 | Disposition: A | Discharge status: Home / Self Care | Payer: Medicare Other | Source: Ambulatory Visit | Attending: Internal Medicine | Admitting: Internal Medicine

## 2022-10-28 DIAGNOSIS — Z1231 Encounter for screening mammogram for malignant neoplasm of breast: Secondary | ICD-10-CM

## 2022-11-03 ENCOUNTER — Ambulatory Visit
Admission: RE | Admit: 2022-11-03 | Discharge: 2022-11-03 | Disposition: A | Discharge status: Home / Self Care | Payer: Medicare Other | Source: Ambulatory Visit | Attending: Physician Assistant | Admitting: Physician Assistant

## 2023-04-20 ENCOUNTER — Ambulatory Visit: Payer: Medicare Other | Admitting: Physician Assistant

## 2023-04-20 ENCOUNTER — Encounter: Payer: Self-pay | Admitting: Physician Assistant

## 2023-04-20 DIAGNOSIS — R413 Other amnesia: Secondary | ICD-10-CM

## 2023-04-20 NOTE — Progress Notes (Addendum)
 Assessment/Plan:   Dementia likely due to Alzheimer's disease and vascular etiology  Janet Brown is a very pleasant 77 y.o. RH female with a history of hypertension, hyperlipidemia, DDD with cervical arthritis and history of lumbar right radiculopathy, eczema, hypothyroidism seen today in follow up for memory loss.  Patient still demonstrates apathy and anosognosia, which may be indicating dementia, likely of Alzheimer's etiology.  MRI brain 10/2022 remarkable for chronic microvascular changes and atrophy. Findings were discusses, patient declined seeing her films. MMSE is 19/30, worse from prior. She declines any medications such as ACHI and if necessary, would prefer Dr. Timothy Lasso (PCP) to manage meds.       Follow up in 6  months. Consider starting ACHI as per PCP  Recommend good control of her cardiovascular risk factors Continue to control mood as per PCP Continue  B12 supplementation     Subjective:    This patient is accompanied in the office by her husband who supplements the history.  Previous records as well as any outside records available were reviewed prior to todays visit. Patient was last seen on 10/15/2022, at which time MoCA was unable to be completed, MMSE was 22/30   Any changes in memory since last visit? " My memory is doing well, is improving".  She denies cognitive complaints. During the day, likes to go out with her husband, does not like to do any brain games.  repeats oneself?  Denies, although she has been repeating herself several times during this visit. Disoriented when walking into a room?  Patient denies   Leaving objects in unusual places?  May misplace things, such as the keys,  but not in unusual places.   Wandering behavior?  Denies.  However "I always have somebody with me, I am never alone " Any personality changes since last visit?  denies   Any worsening depression?:  Denies.   Hallucinations or paranoia?  Recently she has seen some bugs, but  there were pine needles.  Seizures? denies    Any sleep changes?  Sleeps well.  Denies vivid dreams, REM behavior or sleepwalking   Sleep apnea?   Denies.   Any hygiene concerns? Denies.  Independent of bathing and dressing?  Endorsed  Does the patient needs help with medications?  Husband is in charge   Who is in charge of the finances?  Husband is in charge     Any changes in appetite?  Denies.     Patient have trouble swallowing? Denies.   Does the patient cook? No Any headaches?   denies   Chronic back pain  denies   Ambulates with difficulty? Denies.   Recent falls or head injuries? Denies.     Unilateral weakness, numbness or tingling? Denies.   Any tremors?  Denies   Any anosmia?  Denies   Any incontinence of urine?  Endorsed, wears diapers Any bowel dysfunction?   Denies      Patient lives with  her husband Does the patient drive? No longer drives. "He took over".     Initial visit 10/15/2022 How long did patient have memory difficulties? " I was very sick for 2 months and one of those viruses from another country (could not remember the word CoVID). That was the start of it".  She denies any memory changes remembering conversations or people's names, "I am doing very well. My doctor sent me here."  repeats oneself? She denies (but repeated some sentences several times) Disoriented when walking into a  room?  Denies .  Leaving objects in unusual places? denies   Wandering behavior?  denies , but "I always have another person with me. I am never alone" Any personality changes since last visit?  Patient denies   Any worsening depression?:  Patient denies   Hallucinations or paranoia?  Patient denies   Seizures?   Patient denies    Any sleep changes?" I sleep like a dog, and take naps too"  Denies vivid dreams, REM behavior or sleepwalking   Sleep apnea?  Patient denies   Any hygiene concerns?  Patient denies   Independent of bathing and dressing?  Endorsed  Does the patient  needs help with medications? Husband is in charge   Who is in charge of the finances? Husband  is in charge "took over when I started having little problems"     Any changes in appetite?   Denies, it is good.      Patient have trouble swallowing?   denies   Does the patient cook? If I want to   Any kitchen accidents such as leaving the stove on? Patient denies   Any headaches?   denies   Chronic back pain  in chart there is indication of lumbar radiculopathy but patient adamantly denies  Ambulates with difficulty?    denies   Recent falls or head injuries?   In July 2022, the patient was seen at the ER after a mechanical fall striking her forehead at the edge of the tablet without loss of consciousness. Unilateral weakness, numbness or tingling?  denies   Any tremors?   denies   Any anosmia?  denies   Any incontinence of urine?denies  Any bowel dysfunction?    denies      Patient lives  with husband  History of heavy alcohol intake? denies   History of heavy tobacco use?  denies   Family history of dementia? mother may have had dementia, father had ETOH complications  Does patient drive? Husband he held the keys, "I can drive if I want to of course, If I need to drive I would "  MRI of the brain, personally reviewed (11/05/2022) remarkable for moderate chronic small vessel disease and mild cerebral atrophy  PREVIOUS MEDICATIONS:   CURRENT MEDICATIONS:  Outpatient Encounter Medications as of 04/20/2023  Medication Sig   cyanocobalamin (VITAMIN B12) 1000 MCG tablet Take 1,000 mcg by mouth daily.   levothyroxine (SYNTHROID, LEVOTHROID) 100 MCG tablet Take 76 mcg by mouth daily before breakfast.   ibuprofen (ADVIL,MOTRIN) 200 MG tablet Take 200 mg by mouth every 6 (six) hours as needed (pain).   [DISCONTINUED] gabapentin (NEURONTIN) 300 MG capsule Start 1 tablet at bedtime for one week, then increase to 1 tablet twice daily (Patient not taking: Reported on 10/15/2022)   No  facility-administered encounter medications on file as of 04/20/2023.       04/20/2023    2:00 PM 10/15/2022    2:00 PM  MMSE - Mini Mental State Exam  Orientation to time 3 4  Orientation to Place 3 5  Registration 2 3  Attention/ Calculation 4 3  Recall 0 0  Language- name 2 objects 2 1  Language- repeat 1 1  Language- follow 3 step command 3 3  Language- read & follow direction 1 1  Write a sentence 0 1  Copy design 0 0  Total score 19 22       No data to display  Objective:     PHYSICAL EXAMINATION:    VITALS:  There were no vitals filed for this visit.  GEN:  The patient appears stated age and is in NAD. HEENT:  Normocephalic, atraumatic.   Neurological examination:  General: NAD, well-groomed, appears stated age. Orientation: The patient is alert. Oriented to person, not to place and date Cranial nerves: There is good facial symmetry.The speech is fluent and clear, but hesitant at times. No aphasia or dysarthria. Fund of knowledge is reduced. Recent and remote memory are impaired. Attention and concentration are reduced.  Able to name objects and repeat phrases.  Hearing is intact to conversational tone.   Sensation: Sensation is intact to light touch throughout Motor: Strength is at least antigravity x4. DTR's 1/4 in UE/LE     Movement examination: Tone: There is normal tone in the UE/LE Abnormal movements:  no tremor.  No myoclonus.  No asterixis.   Coordination:  There is no decremation with RAM's. Normal finger to nose  Gait and Station: The patient has no difficulty arising out of a deep-seated chair without the use of the hands. The patient's stride length is shorter than prior visit. Gait is cautious and narrow.    Thank you for allowing [place] the opportunity to participate in the care of this nice patient. Please do not hesitate to contact us for any questions or concerns.   Total time spent on today's visit was 37 minutes dedicated to this  patient today, preparing to see patient, examining the patient, ordering tests and/or medications and counseling the patient, documenting clinical information in the EHR or other health record, independently interpreting results and communicating results to the patient/family, discussing treatment and goals, answering patient's questions and coordinating care.  Cc:  Creola Corn, MD  Marlowe Kays 04/20/2023 3:04 PM

## 2023-04-20 NOTE — Patient Instructions (Addendum)
It was a pleasure to see you today at our office.   Recommendations:  Follow up in  6 months Recommend starting Aricept, talk to your doctor about it.  Recommend B12 supplement    Whom to call:  Memory  decline, memory medications: Call our office [phone]   For psychiatric meds, mood meds: Please have your primary care physician manage these medications.       For assessment of decision of mental capacity and competency:  Call Dr. Erick Blinks, geriatric psychiatrist at [phone]     If you have any severe symptoms of a stroke, or other severe issues such as confusion,severe chills or fever, etc call 911 or go to the ER as you may need to be evaluated further      RECOMMENDATIONS FOR ALL PATIENTS WITH MEMORY PROBLEMS: 1. Continue to exercise (Recommend 30 minutes of walking everyday, or 3 hours every week) 2. Increase social interactions - continue going to [place] and enjoy social gatherings with friends and family 3. Eat healthy, avoid fried foods and eat more fruits and vegetables 4. Maintain adequate blood pressure, blood sugar, and blood cholesterol level. Reducing the risk of stroke and cardiovascular disease also helps promoting better memory. 5. Avoid stressful situations. Live a simple life and avoid aggravations. Organize your time and prepare for the next day in anticipation. 6. Sleep well, avoid any interruptions of sleep and avoid any distractions in the bedroom that may interfere with adequate sleep quality 7. Avoid sugar, avoid sweets as there is a strong link between excessive sugar intake, diabetes, and cognitive impairment We discussed the Mediterranean diet, which has been shown to help patients reduce the risk of progressive memory disorders and reduces cardiovascular risk. This includes eating fish, eat fruits and green leafy vegetables, nuts like almonds and hazelnuts, walnuts, and also use olive oil. Avoid fast foods and fried foods as much as  possible. Avoid sweets and sugar as sugar use has been linked to worsening of memory function.  There is always a concern of gradual progression of memory problems. If this is the case, then we may need to adjust level of care according to patient needs. Support, both to the patient and caregiver, should then be put into place.    FALL PRECAUTIONS: Be cautious when walking. Scan the area for obstacles that may increase the risk of trips and falls. When getting up in the mornings, sit up at the edge of the bed for a few minutes before getting out of bed. Consider elevating the bed at the head end to avoid drop of blood pressure when getting up. Walk always in a well-lit room (use night lights in the walls). Avoid area rugs or power cords from appliances in the middle of the walkways. Use a walker or a cane if necessary and consider physical therapy for balance exercise. Get your eyesight checked regularly.  FINANCIAL OVERSIGHT: Supervision, especially oversight when making financial decisions or transactions is also recommended.  HOME SAFETY: Consider the safety of the kitchen when operating appliances like stoves, microwave oven, and blender. Consider having supervision and share cooking responsibilities until no longer able to participate in those. Accidents with firearms and other hazards in the house should be identified and addressed as well.   ABILITY TO BE LEFT ALONE: If patient is unable to contact 911 operator, consider using [employer]Line, or when the need is there, arrange for someone to stay with patients. Smoking is a fire hazard, consider supervision or cessation. Risk  of wandering should be assessed by caregiver and if detected at any point, supervision and safe proof recommendations should be instituted.  MEDICATION SUPERVISION: Inability to self-administer medication needs to be constantly addressed. Implement a mechanism to ensure safe administration of the medications.   Your provider  has requested that you have labwork completed today. Please go to [facility] ([id number]) on the second floor of this building before leaving the office today. You do not need to check in. If you are not called within 15 minutes please check with the front desk. ..  MRI will be at [facility] [phone].

## 2023-04-21 ENCOUNTER — Telehealth: Payer: Self-pay | Admitting: Physician Assistant

## 2023-04-21 NOTE — Telephone Encounter (Signed)
Pt's whusband called stating that the patient and him were discussing the medication option that they were given at past appointment and would like to go that route. Pt didn't know the medication name

## 2023-04-22 ENCOUNTER — Other Ambulatory Visit: Payer: Self-pay

## 2023-04-22 MED ORDER — DONEPEZIL HCL 5 MG PO TABS: 5.0000 mg | ORAL_TABLET | Freq: Every day | ORAL | 1 refills | Status: DC

## 2023-04-22 NOTE — Telephone Encounter (Signed)
Meds sent per Trixie Dredge PA-C

## 2023-05-14 ENCOUNTER — Other Ambulatory Visit: Payer: Self-pay | Admitting: Physician Assistant

## 2023-08-17 ENCOUNTER — Other Ambulatory Visit: Payer: Self-pay | Admitting: Physician Assistant

## 2023-10-22 ENCOUNTER — Ambulatory Visit: Payer: Medicare Other | Admitting: Physician Assistant

## 2023-10-22 ENCOUNTER — Encounter: Payer: Self-pay | Admitting: Physician Assistant

## 2023-10-22 VITALS — BP 111/72 | HR 80 | Resp 18 | Wt 132.0 lb

## 2023-10-22 DIAGNOSIS — R413 Other amnesia: Secondary | ICD-10-CM

## 2023-10-22 MED ORDER — DONEPEZIL HCL 10 MG PO TABS: 10.0000 mg | ORAL_TABLET | Freq: Every day | ORAL | 3 refills | Status: AC

## 2023-10-22 NOTE — Progress Notes (Signed)
 Assessment/Plan:   Dementia likely due to Alzheimer's disease and vascular etiology  Janet Brown is a very pleasant 78 y.o. RH female with a history ofhypertension, hyperlipidemia, DDD with cervical arthritis and history of lumbar right radiculopathy, eczema, hypothyroidism seen today in follow up for memory loss. She still demonstrates apathy and anosognosia, a feature suspicious for Alzheimer's etiology.  She is on donepezil 5 mg daily. MMSE is 18/30, memory is stable. Patient is able to participate on ADLs and to drive without difficulties ,no longer drives. Mood stable.      Follow up in 6  months. Recommend increasing donepezil to 10 mg daily. Side effects discussed Recommend good control of her cardiovascular risk factors Continue to control mood as per PCP Continue B12 supplementation    Subjective:    This patient is accompanied in the office by her husband who supplements the history.  Previous records as well as any outside records available were reviewed prior to todays visit. Patient was last seen on 04/20/2023 with MMSE 19/30.    Any changes in memory since last visit? "About the same".  She denies any cognitive complaints.  During the day, she likes to go out with her husband, does not like doing any brain stimulating exercises. repeats oneself?  Endorsed, this was witnessed during this visit. Disoriented when walking into a room?  Patient denies    Leaving objects?  May misplace things such as car keys but not in unusual places   Wandering behavior?  denies.  "I am never alone " Any personality changes since last visit?  denies   Any worsening depression?:  Denies.   Hallucinations or paranoia?  Denies.   Seizures? denies    Any sleep changes?  Sleeps well.  Denies vivid dreams, REM behavior or sleepwalking   Sleep apnea?   Denies.   Any hygiene concerns? Denies.  Independent of bathing and dressing?  Endorsed  Does the patient needs help with medications?   Husband is in charge   Who is in charge of the finances?  Husband is in charge     Any changes in appetite?  Denies.  Patient have trouble swallowing? Denies.   Does the patient cook? No Any headaches?   denies   Chronic back pain  denies   Ambulates with difficulty? Denies. Walks daily.  Recent falls or head injuries? Mechanical fall 1 month ago when falling from bed, no apparent fracture, did not seek medical attention. No LOC Unilateral weakness, numbness or tingling? denies   Any tremors?  Denies   Any anosmia?  Denies   Any incontinence of urine?  Endorsed, wears diapers Any bowel dysfunction?   Denies      Patient lives with her husband Does the patient drive? No longer drives   Initial visit 10/15/2022 How long did patient have memory difficulties? " I was very sick for 2 months and one of those viruses from another country (could not remember the word CoVID). That was the start of it".  She denies any memory changes remembering conversations or people's names, "I am doing very well. My doctor sent me here."  repeats oneself? She denies (but repeated some sentences several times) Disoriented when walking into a room?  Denies .  Leaving objects in unusual places? denies   Wandering behavior?  denies , but "I always have another person with me. I am never alone" Any personality changes since last visit?  Patient denies   Any worsening depression?:  Patient  denies   Hallucinations or paranoia?  Patient denies   Seizures?   Patient denies    Any sleep changes?" I sleep like a dog, and take naps too"  Denies vivid dreams, REM behavior or sleepwalking   Sleep apnea?  Patient denies   Any hygiene concerns?  Patient denies   Independent of bathing and dressing?  Endorsed  Does the patient needs help with medications? Husband is in charge   Who is in charge of the finances? Husband  is in charge "took over when I started having little problems"     Any changes in appetite?   Denies, it  is good.      Patient have trouble swallowing?   denies   Does the patient cook? If I want to   Any kitchen accidents such as leaving the stove on? Patient denies   Any headaches?   denies   Chronic back pain  in chart there is indication of lumbar radiculopathy but patient adamantly denies  Ambulates with difficulty?    denies   Recent falls or head injuries?   In July 2022, the patient was seen at the ER after a mechanical fall striking her forehead at the edge of the tablet without loss of consciousness. Unilateral weakness, numbness or tingling?  denies   Any tremors?   denies   Any anosmia?  denies   Any incontinence of urine?denies  Any bowel dysfunction?    denies      Patient lives  with husband  History of heavy alcohol intake? denies   History of heavy tobacco use?  denies   Family history of dementia? mother may have had dementia, father had ETOH complications  Does patient drive? Husband he held the keys, "I can drive if I want to of course, If I need to drive I would "   MRI of the brain, personally reviewed (11/05/2022) remarkable for moderate chronic small vessel disease and mild cerebral atrophy PREVIOUS MEDICATIONS:   CURRENT MEDICATIONS:  Outpatient Encounter Medications as of 10/22/2023  Medication Sig   cyanocobalamin (VITAMIN B12) 1000 MCG tablet Take 1,000 mcg by mouth daily.   ibuprofen (ADVIL,MOTRIN) 200 MG tablet Take 200 mg by mouth every 6 (six) hours as needed (pain).   levothyroxine (SYNTHROID, LEVOTHROID) 100 MCG tablet Take 76 mcg by mouth daily before breakfast.   [DISCONTINUED] donepezil (ARICEPT) 5 MG tablet TAKE 1 TABLET BY MOUTH EVERYDAY AT BEDTIME   donepezil (ARICEPT) 10 MG tablet Take 1 tablet (10 mg total) by mouth at bedtime.   No facility-administered encounter medications on file as of 10/22/2023.       10/23/2023    5:00 PM 04/20/2023    2:00 PM 10/15/2022    2:00 PM  MMSE - Mini Mental State Exam  Orientation to time 3 3 4   Orientation to  Place 3 3 5   Registration 3 2 3   Attention/ Calculation 2 4 3   Recall 0 0 0  Language- name 2 objects 2 2 1   Language- repeat 1 1 1   Language- follow 3 step command 3 3 3   Language- read & follow direction 1 1 1   Write a sentence 0 0 1  Copy design 0 0 0  Total score 18 19 22        No data to display          Objective:     PHYSICAL EXAMINATION:    VITALS:   Vitals:   10/22/23 1435  BP: 111/72  Pulse: 80  Resp: 18  SpO2: 100%  Weight: 132 lb (59.9 kg)    GEN:  The patient appears stated age and is in NAD. HEENT:  Normocephalic, atraumatic.   Neurological examination:  General: NAD, well-groomed, appears stated age. Orientation: The patient is alert. Oriented to person, not to place and date Cranial nerves: There is good facial symmetry.The speech is fluent and clear, at times hesitant. No aphasia or dysarthria. Fund of knowledge is reduced. Recent and remote memory are impaired. Attention and concentration are reduced.  Able to name objects and repeat phrases.  Hearing is intact to conversational tone. Delayed recall 0/3 Sensation: Sensation is intact to light touch throughout Motor: Strength is at least antigravity x4. DTR's 1/4 in UE/LE     Movement examination: Tone: There is normal tone in the UE/LE Abnormal movements:  no tremor.  No myoclonus.  No asterixis.   Coordination:  There is no decremation with RAM's. Normal finger to nose  Gait and Station: The patient has no difficulty arising out of a deep-seated chair without the use of the hands. The patient's stride length is short.  Gait is cautious and narrow.    Thank you for allowing [place] the opportunity to participate in the care of this nice patient. Please do not hesitate to contact us for any questions or concerns.   Total time spent on today's visit was 20 minutes dedicated to this patient today, preparing to see patient, examining the patient, ordering tests and/or medications and counseling the  patient, documenting clinical information in the EHR or other health record, independently interpreting results and communicating results to the patient/family, discussing treatment and goals, answering patient's questions and coordinating care.  Cc:  Creola Corn, MD  Marlowe Kays 10/23/2023 5:28 PM

## 2023-10-22 NOTE — Patient Instructions (Signed)
 It was a pleasure to see you today at our office.   Recommendations:  Follow up in  6 months Increase to 10 mg daily Recommend B12 supplement    Whom to call:  Memory  decline, memory medications: Call our office [phone]   For psychiatric meds, mood meds: Please have your primary care physician manage these medications.       For assessment of decision of mental capacity and competency:  Call Dr. Erick Blinks, geriatric psychiatrist at [phone]     If you have any severe symptoms of a stroke, or other severe issues such as confusion,severe chills or fever, etc call 911 or go to the ER as you may need to be evaluated further      RECOMMENDATIONS FOR ALL PATIENTS WITH MEMORY PROBLEMS: 1. Continue to exercise (Recommend 30 minutes of walking everyday, or 3 hours every week) 2. Increase social interactions - continue going to [place] and enjoy social gatherings with friends and family 3. Eat healthy, avoid fried foods and eat more fruits and vegetables 4. Maintain adequate blood pressure, blood sugar, and blood cholesterol level. Reducing the risk of stroke and cardiovascular disease also helps promoting better memory. 5. Avoid stressful situations. Live a simple life and avoid aggravations. Organize your time and prepare for the next day in anticipation. 6. Sleep well, avoid any interruptions of sleep and avoid any distractions in the bedroom that may interfere with adequate sleep quality 7. Avoid sugar, avoid sweets as there is a strong link between excessive sugar intake, diabetes, and cognitive impairment We discussed the Mediterranean diet, which has been shown to help patients reduce the risk of progressive memory disorders and reduces cardiovascular risk. This includes eating fish, eat fruits and green leafy vegetables, nuts like almonds and hazelnuts, walnuts, and also use olive oil. Avoid fast foods and fried foods as much as possible. Avoid sweets and sugar as sugar  use has been linked to worsening of memory function.  There is always a concern of gradual progression of memory problems. If this is the case, then we may need to adjust level of care according to patient needs. Support, both to the patient and caregiver, should then be put into place.    FALL PRECAUTIONS: Be cautious when walking. Scan the area for obstacles that may increase the risk of trips and falls. When getting up in the mornings, sit up at the edge of the bed for a few minutes before getting out of bed. Consider elevating the bed at the head end to avoid drop of blood pressure when getting up. Walk always in a well-lit room (use night lights in the walls). Avoid area rugs or power cords from appliances in the middle of the walkways. Use a walker or a cane if necessary and consider physical therapy for balance exercise. Get your eyesight checked regularly.  FINANCIAL OVERSIGHT: Supervision, especially oversight when making financial decisions or transactions is also recommended.  HOME SAFETY: Consider the safety of the kitchen when operating appliances like stoves, microwave oven, and blender. Consider having supervision and share cooking responsibilities until no longer able to participate in those. Accidents with firearms and other hazards in the house should be identified and addressed as well.   ABILITY TO BE LEFT ALONE: If patient is unable to contact 911 operator, consider using [employer], or when the need is there, arrange for someone to stay with patients. Smoking is a fire hazard, consider supervision or cessation. Risk of wandering should be assessed  by caregiver and if detected at any point, supervision and safe proof recommendations should be instituted.  MEDICATION SUPERVISION: Inability to self-administer medication needs to be constantly addressed. Implement a mechanism to ensure safe administration of the medications.   Your provider has requested that you have labwork  completed today. Please go to [facility] ([id number]) on the second floor of this building before leaving the office today. You do not need to check in. If you are not called within 15 minutes please check with the front desk. ..  MRI will be at [facility] [phone].

## 2024-02-29 ENCOUNTER — Ambulatory Visit: Admitting: Physician Assistant

## 2024-02-29 ENCOUNTER — Encounter: Payer: Self-pay | Admitting: Physician Assistant

## 2024-02-29 VITALS — BP 130/66 | HR 85 | Resp 18 | Ht 64.0 in

## 2024-02-29 DIAGNOSIS — G309 Alzheimer's disease, unspecified: Secondary | ICD-10-CM

## 2024-02-29 DIAGNOSIS — F0280 Dementia in other diseases classified elsewhere without behavioral disturbance: Secondary | ICD-10-CM | POA: Diagnosis not present

## 2024-02-29 DIAGNOSIS — F0150 Vascular dementia without behavioral disturbance: Secondary | ICD-10-CM

## 2024-02-29 MED ORDER — MEMANTINE HCL 10 MG PO TABS: ORAL_TABLET | ORAL | 3 refills | Status: AC

## 2024-02-29 NOTE — Progress Notes (Signed)
 Assessment/Plan:   Dementia likely due to Alzheimer's and vascular disease   Janet Brown is a very pleasant 78 y.o. RH female with a history of hypertension, hyperlipidemia, DDD with cervical arthritis and history of lumbar right radiculopathy, eczema, hypothyroidism and a diagnosis of dementia due to Alzheimer's disease and vascular etiology.  She is seen today in follow up for memory loss. Patient is currently on donepezil  10 mg daily.  Cognitive decline is noted, with MMSE today 15/30.  Discussed adding memantine  10 mg twice daily as directed in an effort to slow down memory loss, patient and husband agree.  Patient is able to participate on ADLs, no longer drives.  Mood is stable.    Follow up in 6 months. Continue donepezil  10 mg daily Start memantine  10 mg twice daily as directed, side effects discussed Recommend good control of her cardiovascular risk factors Continue to control mood as per PCP     Subjective:    This patient is accompanied in the office by her husband who supplements the history.  Previous records as well as any outside records available were reviewed prior to todays visit. Patient was last seen on 10/22/2023 with MMSE 18/30.   Any changes in memory since last visit?  A little worse.  She denies any cognitive complaints.  Does not like doing any brain stimulating exercises.  She spends most of the day with her husband. repeats oneself?  Endorsed, weakness during this visit. Disoriented when walking into a room? Denies   Leaving objects?  May misplace things but not in unusual places   Wandering behavior?  Denies Any personality changes since last visit?  She may have moments of irritability Any worsening depression?:  Denies.   Hallucinations or paranoia?  She saw a critter in the bed, trying to craw, and then went away.  No paranoia. Seizures? denies    Any sleep changes? Sleeps well. Denies vivid dreams, REM behavior or sleepwalking   Sleep  apnea?   Denies.   Any hygiene concerns? Denies.  Independent of bathing and dressing?  Endorsed  Does the patient needs help with medications?  Husband is in charge  Who is in charge of the finances?  Husband is in charge     Any changes in appetite?  denies     Patient have trouble swallowing? Denies.   Does the patient cook? No Any headaches?   denies   Any vision changes?denies  Chronic back pain  denies   Ambulates with difficulty? Denies.  She walks daily  Recent falls or head injuries? Denies.     Unilateral weakness, numbness or tingling? denies   Any tremors?  Denies    Any anosmia?  Denies   Any incontinence of urine?  Endorsed, wears diapers   Any bowel dysfunction?   Denies      Patient lives with her husband     Does the patient drive? No longer drives     Initial visit 10/15/2022 How long did patient have memory difficulties?  I was very sick for 2 months and one of those viruses from another country (could not remember the word CoVID). That was the start of it.  She denies any memory changes remembering conversations or people's names, I am doing very well. My doctor sent me here.  repeats oneself? She denies (but repeated some sentences several times) Disoriented when walking into a room?  Denies .  Leaving objects in unusual places? denies   Wandering behavior?  denies , but I always have another person with me. I am never alone Any personality changes since last visit?  Patient denies   Any worsening depression?:  Patient denies   Hallucinations or paranoia?  Patient denies   Seizures?   Patient denies    Any sleep changes? I sleep like a dog, and take naps too  Denies vivid dreams, REM behavior or sleepwalking   Sleep apnea?  Patient denies   Any hygiene concerns?  Patient denies   Independent of bathing and dressing?  Endorsed  Does the patient needs help with medications? Husband is in charge   Who is in charge of the finances? Husband  is in charge  took over when I started having little problems     Any changes in appetite?   Denies, it is good.      Patient have trouble swallowing?   denies   Does the patient cook? If I want to   Any kitchen accidents such as leaving the stove on? Patient denies   Any headaches?   denies   Chronic back pain  in chart there is indication of lumbar radiculopathy but patient adamantly denies  Ambulates with difficulty?    denies   Recent falls or head injuries?   In July 2022, the patient was seen at the ER after a mechanical fall striking her forehead at the edge of the tablet without loss of consciousness. Unilateral weakness, numbness or tingling?  denies   Any tremors?   denies   Any anosmia?  denies   Any incontinence of urine?denies  Any bowel dysfunction?    denies      Patient lives  with husband  History of heavy alcohol intake? denies   History of heavy tobacco use?  denies   Family history of dementia? mother may have had dementia, father had ETOH complications  Does patient drive? Husband he held the keys, I can drive if I want to of course, If I need to drive I would    MRI of the brain, personally reviewed (11/05/2022) remarkable for moderate chronic small vessel disease and mild cerebral atrophy  PREVIOUS MEDICATIONS:   CURRENT MEDICATIONS:  Outpatient Encounter Medications as of 02/29/2024  Medication Sig   Cholecalciferol (VITAMIN D3) 25 MCG (1000 UT) CAPS 1 capsule.   cyanocobalamin  (VITAMIN B12) 1000 MCG tablet Take 1,000 mcg by mouth daily.   donepezil  (ARICEPT ) 10 MG tablet Take 1 tablet (10 mg total) by mouth at bedtime.   ibuprofen (ADVIL,MOTRIN) 200 MG tablet Take 200 mg by mouth every 6 (six) hours as needed (pain).   levothyroxine (SYNTHROID, LEVOTHROID) 100 MCG tablet Take 76 mcg by mouth daily before breakfast.   memantine  (NAMENDA ) 10 MG tablet Take 1 tablet (10 mg at night) for 2 weeks, then increase to 1 tablet (10 mg) twice a day   metFORMIN (GLUCOPHAGE) 500 MG  tablet Take 500 mg by mouth daily.   No facility-administered encounter medications on file as of 02/29/2024.       10/23/2023    5:00 PM 04/20/2023    2:00 PM 10/15/2022    2:00 PM  MMSE - Mini Mental State Exam  Orientation to time 3 3 4   Orientation to Place 3 3 5   Registration 3 2 3   Attention/ Calculation 2 4 3   Recall 0 0 0  Language- name 2 objects 2 2 1   Language- repeat 1 1 1   Language- follow 3 step command 3 3 3   Language-  read & follow direction 1 1 1   Write a sentence 0 0 1  Copy design 0 0 0  Total score 18 19 22        No data to display          Objective:     PHYSICAL EXAMINATION:    VITALS:   Vitals:   02/29/24 1535  BP: 130/66  Pulse: 85  Resp: 18  SpO2: 98%  Height: 5' 4 (1.626 m)    GEN:  The patient appears stated age and is in NAD. HEENT:  Normocephalic, atraumatic.   Neurological examination:  General: NAD, well-groomed, appears stated age. Orientation: The patient is alert. Oriented to person, not to place and date Cranial nerves: There is good facial symmetry.The speech is mostly fluent with moments of hesitancy when she cannot find the word, but clear.  No aphasia or dysarthria. Fund of knowledge is reduced. Recent and remote memory are impaired. Attention and concentration are reduced. Able to name objects and repeat phrases.  Hearing is intact to conversational tone.   Sensation: Sensation is intact to light touch throughout Motor: Strength is at least antigravity x4. DTR's 2/4 in UE/LE     Movement examination: Tone: There is normal tone in the UE/LE Abnormal movements:  no tremor.  No myoclonus.  No asterixis.   Coordination:  There is no decremation with RAM's. Normal finger to nose  Gait and Station: The patient has no  difficulty arising out of a deep-seated chair without the use of the hands. The patient's stride length is good.  Gait is cautious and narrow.    Thank you for allowing us  the opportunity to participate in  the care of this nice patient. Please do not hesitate to contact us  for any questions or concerns.   Total time spent on today's visit was 23 minutes dedicated to this patient today, preparing to see patient, examining the patient, ordering tests and/or medications and counseling the patient, documenting clinical information in the EHR or other health record, independently interpreting results and communicating results to the patient/family, discussing treatment and goals, answering patient's questions and coordinating care.  Cc:  Onita Rush, MD  Camie Sevin 02/29/2024 3:51 PM

## 2024-02-29 NOTE — Patient Instructions (Signed)
 It was a pleasure to see you today at our office.   Recommendations:  Follow up in  6 months Continue donepezil  10 mg daily Start Memantine  10mg  tablets.  Take 1 tablet at bedtime for 2 weeks, then 1 tablet twice daily.        Whom to call:  Memory  decline, memory medications: Call our office [phone]   For psychiatric meds, mood meds: Please have your primary care physician manage these medications.       For assessment of decision of mental capacity and competency:  Call Dr. Rosaline Nine, geriatric psychiatrist at [phone]     If you have any severe symptoms of a stroke, or other severe issues such as confusion,severe chills or fever, etc call 911 or go to the ER as you may need to be evaluated further      RECOMMENDATIONS FOR ALL PATIENTS WITH MEMORY PROBLEMS: 1. Continue to exercise (Recommend 30 minutes of walking everyday, or 3 hours every week) 2. Increase social interactions - continue going to [place] and enjoy social gatherings with friends and family 3. Eat healthy, avoid fried foods and eat more fruits and vegetables 4. Maintain adequate blood pressure, blood sugar, and blood cholesterol level. Reducing the risk of stroke and cardiovascular disease also helps promoting better memory. 5. Avoid stressful situations. Live a simple life and avoid aggravations. Organize your time and prepare for the next day in anticipation. 6. Sleep well, avoid any interruptions of sleep and avoid any distractions in the bedroom that may interfere with adequate sleep quality 7. Avoid sugar, avoid sweets as there is a strong link between excessive sugar intake, diabetes, and cognitive impairment We discussed the Mediterranean diet, which has been shown to help patients reduce the risk of progressive memory disorders and reduces cardiovascular risk. This includes eating fish, eat fruits and green leafy vegetables, nuts like almonds and hazelnuts, walnuts, and also use olive oil.  Avoid fast foods and fried foods as much as possible. Avoid sweets and sugar as sugar use has been linked to worsening of memory function.  There is always a concern of gradual progression of memory problems. If this is the case, then we may need to adjust level of care according to patient needs. Support, both to the patient and caregiver, should then be put into place.    FALL PRECAUTIONS: Be cautious when walking. Scan the area for obstacles that may increase the risk of trips and falls. When getting up in the mornings, sit up at the edge of the bed for a few minutes before getting out of bed. Consider elevating the bed at the head end to avoid drop of blood pressure when getting up. Walk always in a well-lit room (use night lights in the walls). Avoid area rugs or power cords from appliances in the middle of the walkways. Use a walker or a cane if necessary and consider physical therapy for balance exercise. Get your eyesight checked regularly.  FINANCIAL OVERSIGHT: Supervision, especially oversight when making financial decisions or transactions is also recommended.  HOME SAFETY: Consider the safety of the kitchen when operating appliances like stoves, microwave oven, and blender. Consider having supervision and share cooking responsibilities until no longer able to participate in those. Accidents with firearms and other hazards in the house should be identified and addressed as well.   ABILITY TO BE LEFT ALONE: If patient is unable to contact 911 operator, consider using LifeLine, or when the need is there, arrange for someone  to stay with patients. Smoking is a fire hazard, consider supervision or cessation. Risk of wandering should be assessed by caregiver and if detected at any point, supervision and safe proof recommendations should be instituted.  MEDICATION SUPERVISION: Inability to self-administer medication needs to be constantly addressed. Implement a mechanism to ensure safe  administration of the medications.   Your provider has requested that you have labwork completed today. Please go to [facility] ([address]) on the second floor of this building before leaving the office today. You do not need to check in. If you are not called within 15 minutes please check with the front desk. ..  MRI will be at [facility] [phone].

## 2024-04-05 ENCOUNTER — Ambulatory Visit: Payer: Medicare Other | Admitting: Physician Assistant

## 2024-08-30 NOTE — Progress Notes (Signed)
 "   Mixed Alzheimer's and vascular dementia   Janet Brown is a very pleasant 79 y.o. RH female with a history of hypertension, hyperlipidemia, DDD with cervical arthritis and history of lumbar right radiculopathy, eczema, hypothyroidism and a diagnosis of dementia due to Alzheimer's disease and vascular etiology seen today in follow up for memory loss. Patient is currently on donepezil  10 mg daily and memantine  10 mg twice daily, tolerating well. This patient is accompanied in the office by her husband and her niece who supplements the history.  Previous records as well as any outside records available were reviewed prior to todays visit. Patient was last seen on 02/29/2024 . Memory is stable as reported by her husband patient is able to participate on ADLs, no longer drives.  Mood is stable.  Discussed the importance of socialization and physical activity as an effort to maintain her memory.  Continue Donepezil  10 mg daily and memantine  10 mg twice daily, side effects discussed Recommend good control of cardiovascular risk factors.   Continue to control mood as per PCP Follow up in 6 months   Discussed the use of AI scribe software for clinical note transcription with the patient, who gave verbal consent to proceed.  History of Present Illness Crytal Brown is an 79 year old female with dementia who presents for a follow-up for memory loss. She is accompanied by her husband, Christopher, who is her primary caregiver and her niece.  She has been experiencing memory lapses, characterized by repeating questions or stories. Her husband notes that she sometimes asks the same question multiple times and occasionally needs reminders about her daily routine, especially when he was hospitalized recently which took her out of her everyday life . Despite these lapses, she recognizes her home and is able to perform daily activities such as dressing, showering, and cleaning the kitchen.  She is  currently taking donepezil  10 mg daily and memantine  10 mg twice a day. No significant mood changes are reported, though she occasionally resists taking her medication. There are no signs of aggression or depression. She used to report seeing 'critters' in her bed, which her husband attributes to leaves brought in by their dog.  Her appetite is moderate, and she maintains a routine of having a good breakfast, a light lunch, and an early dinner. She has experienced occasional difficulty swallowing certain foods, which resolves after a brief pause.  Her sleep is generally good, though she wakes up to use the bathroom due to a history of overactive bladder. She denies any current issues with diarrhea or constipation despite having irritable bowel syndrome, which she manages by avoiding certain foods.  No headaches, major pain, or vision changes, though she has dry eyes. She walks daily, albeit with short distances, and sometimes uses her husband's arm for support.  She does not like using the cane or a walker.  She no longer drives and does not always recognize places when out.    Initial visit 10/15/2022 How long did patient have memory difficulties?  I was very sick for 2 months and one of those viruses from [place] country (could not remember the word CoV[place]). That was the start of it.  She denies any memory changes remembering conversations or people's names, I am doing very well. My doctor sent me here.  repeats oneself? She denies (but repeated some sentences several times) Disoriented when walking into a room?  Denies .  Leaving objects in unusual places? denies   Wandering  behavior?  denies , but I always have another person with me. I am never alone Any personality changes since last visit?  Patient denies   Any worsening depression?:  Patient denies   Hallucinations or paranoia?  Patient denies   Seizures?   Patient denies    Any sleep changes? I sleep like a dog, and take naps too   Denies vivid dreams, REM behavior or sleepwalking   Sleep apnea?  Patient denies   Any hygiene concerns?  Patient denies   Independent of bathing and dressing?  Endorsed  Does the patient needs help with medications? Husband is in charge   Who is in charge of the finances? Husband  is in charge took over when I started having little problems     Any changes in appetite?   Denies, it is good.      Patient have trouble swallowing?   denies   Does the patient cook? If I want to   Any kitchen accidents such as leaving the stove on? Patient denies   Any headaches?   denies   Chronic back pain  in chart there is indication of lumbar radiculopathy but patient adamantly denies  Ambulates with difficulty?    denies   Recent falls or head injuries?   In July 2022, the patient was seen at the ER after a mechanical fall striking her forehead at the edge of the tablet without loss of consciousness. Unilateral weakness, numbness or tingling?  denies   Any tremors?   denies   Any anosmia?  denies   Any incontinence of urine?denies  Any bowel dysfunction?    denies      Patient lives  with husband  History of heavy alcohol intake? denies   History of heavy tobacco use?  denies   Family history of dementia? mother may have had dementia, father had ETOH complications  Does patient drive? Husband he held the keys, I can drive if I want to of course, If I need to drive I would    MRI of the brain, personally reviewed (11/05/2022) remarkable for moderate chronic small vessel disease and mild cerebral atrophy          02/29/2024    6:00 PM 10/23/2023    5:00 PM 04/20/2023    2:00 PM  MMSE - Mini Mental State Exam  Orientation to time 1 3 3   Orientation to Place 2 3 3   Registration 3 3 2   Attention/ Calculation 2 2 4   Recall 0 0 0  Language- name 2 objects 2 2 2   Language- repeat 1 1 1   Language- follow 3 step command 3 3 3   Language- read & follow direction 1 1 1   Write a sentence 0 0 0  Copy  design 0 0 0  Total score 15 18 19        No data to display            Objective:    Neurological Exam:    VITALS:   Vitals:   09/01/24 1429  BP: 121/68  Pulse: 79  Resp: 20  SpO2: 97%  Weight: 133 lb (60.3 kg)  Height: 5' 4 (1.626 m)    GEN:  The patient appears stated age and is in NAD. HEENT:  Normocephalic, atraumatic.   Neurological examination:  General: NAD, well-groomed, appears stated age. Orientation: The patient is alert. Oriented to person, not to place and date Cranial nerves: There is good facial symmetry.The speech is fluent and  clear, tangential at times. No aphasia or dysarthria. Fund of knowledge is appropriate. Recent and remote memory are impaired. Attention and concentration are reduced. Able to name objects and repeat phrases.  Hearing is intact to conversational tone.  Sensation: Sensation is intact to light touch throughout Motor: Strength is at least antigravity x4. DTR's 2/4 in UE/LE     Movement examination:  Tone: There is normal tone in the UE/LE Abnormal movements:  no tremor.  No myoclonus.  No asterixis.   Coordination:  There is no decremation with RAM's. Normal finger to nose  Gait and Station: The patient has no difficulty arising out of a deep-seated chair without the use of the hands. The patient's stride length is short, flexes forward, no ataxia.  Gait is cautious and narrow.    Thank you for allowing us  the opportunity to participate in the care of this nice patient. Please do not hesitate to contact us  for any questions or concerns.   Total time spent on today's visit was 31 minutes dedicated to this patient today, preparing to see patient, examining the patient, ordering tests and/or medications and counseling the patient, documenting clinical information in the EHR or other health record, independently interpreting results and communicating results to the patient/family, discussing treatment and goals, answering patient's  questions and coordinating care.  Cc:  Onita Rush, MD  Camie Sevin 09/01/2024 2:51 PM      "

## 2024-09-01 ENCOUNTER — Ambulatory Visit: Admitting: Physician Assistant

## 2024-09-01 ENCOUNTER — Encounter: Payer: Self-pay | Admitting: Physician Assistant

## 2024-09-01 VITALS — BP 121/68 | HR 79 | Resp 20 | Ht 64.0 in | Wt 133.0 lb

## 2024-09-01 DIAGNOSIS — F0280 Dementia in other diseases classified elsewhere without behavioral disturbance: Secondary | ICD-10-CM

## 2024-09-01 DIAGNOSIS — G309 Alzheimer's disease, unspecified: Secondary | ICD-10-CM

## 2024-09-01 DIAGNOSIS — F0150 Vascular dementia without behavioral disturbance: Secondary | ICD-10-CM

## 2024-09-01 NOTE — Patient Instructions (Addendum)
 It was a pleasure to see you today at our office.   Recommendations:  Follow up in  6 months Continue donepezil  10 mg daily Continue Memantine  10mg  tablets twice daily.           Brain games [email or web address]-[email or web address]  RECOMMENDATIONS FOR ALL PATIENTS WITH MEMORY PROBLEMS: 1. Continue to exercise (Recommend 30 minutes of walking everyday, or 3 hours every week) 2. Increase social interactions - continue going to [place] and enjoy social gatherings with friends and family 3. Eat healthy, avoid fried foods and eat more fruits and vegetables 4. Maintain adequate blood pressure, blood sugar, and blood cholesterol level. Reducing the risk of stroke and cardiovascular disease also helps promoting better memory. 5. Avoid stressful situations. Live a simple life and avoid aggravations. Organize your time and prepare for the next day in anticipation. 6. Sleep well, avoid any interruptions of sleep and avoid any distractions in the bedroom that may interfere with adequate sleep quality 7. Avoid sugar, avoid sweets as there is a strong link between excessive sugar intake, diabetes, and cognitive impairment We discussed the Mediterranean diet, which has been shown to help patients reduce the risk of progressive memory disorders and reduces cardiovascular risk. This includes eating fish, eat fruits and green leafy vegetables, nuts like almonds and hazelnuts, walnuts, and also use olive oil. Avoid fast foods and fried foods as much as possible. Avoid sweets and sugar as sugar use has been linked to worsening of memory function.  There is always a concern of gradual progression of memory problems. If this is the case, then we may need to adjust level of care according to patient needs. Support, both to the patient and caregiver, should then be put into place.    FALL PRECAUTIONS: Be cautious when walking. Scan the area for obstacles that may increase the risk  of trips and falls. When getting up in the mornings, sit up at the edge of the bed for a few minutes before getting out of bed. Consider elevating the bed at the head end to avoid drop of blood pressure when getting up. Walk always in a well-lit room (use night lights in the walls). Avoid area rugs or power cords from appliances in the middle of the walkways. Use a walker or a cane if necessary and consider physical therapy for balance exercise. Get your eyesight checked regularly.  FINANCIAL OVERSIGHT: Supervision, especially oversight when making financial decisions or transactions is also recommended.  HOME SAFETY: Consider the safety of the kitchen when operating appliances like stoves, microwave oven, and blender. Consider having supervision and share cooking responsibilities until no longer able to participate in those. Accidents with firearms and other hazards in the house should be identified and addressed as well.   ABILITY TO BE LEFT ALONE: If patient is unable to contact 911 operator, consider using LifeLine, or when the need is there, arrange for someone to stay with patients. Smoking is a fire hazard, consider supervision or cessation. Risk of wandering should be assessed by caregiver and if detected at any point, supervision and safe proof recommendations should be instituted.  MEDICATION SUPERVISION: Inability to self-administer medication needs to be constantly addressed. Implement a mechanism to ensure safe administration of the medications.

## 2025-03-05 ENCOUNTER — Ambulatory Visit: Payer: Self-pay | Admitting: Physician Assistant
